# Patient Record
Sex: Female | Born: 1970
Health system: Southern US, Community
[De-identification: ages and names within clinical notes are randomized; demographics above are authoritative.]

## PROBLEM LIST (undated history)

## (undated) DIAGNOSIS — I10 Essential (primary) hypertension: Secondary | ICD-10-CM

---

## 2013-07-30 ENCOUNTER — Other Ambulatory Visit (HOSPITAL_COMMUNITY): Payer: Self-pay | Admitting: *Deleted

## 2013-07-30 DIAGNOSIS — N6320 Unspecified lump in the left breast, unspecified quadrant: Secondary | ICD-10-CM

## 2013-07-30 DIAGNOSIS — L97121 Non-pressure chronic ulcer of left thigh limited to breakdown of skin: Secondary | ICD-10-CM

## 2013-07-31 ENCOUNTER — Encounter (INDEPENDENT_AMBULATORY_CARE_PROVIDER_SITE_OTHER): Payer: Self-pay

## 2013-07-31 ENCOUNTER — Encounter (HOSPITAL_COMMUNITY): Payer: Self-pay

## 2013-07-31 ENCOUNTER — Ambulatory Visit (HOSPITAL_COMMUNITY)
Admission: RE | Admit: 2013-07-31 | Discharge: 2013-07-31 | Disposition: A | Discharge status: Home / Self Care | Payer: Self-pay | Source: Ambulatory Visit | Attending: Obstetrics and Gynecology | Admitting: Obstetrics and Gynecology

## 2013-07-31 VITALS — BP 128/82 | Temp 99.3°F | Ht 64.0 in | Wt 241.0 lb

## 2013-07-31 DIAGNOSIS — N6320 Unspecified lump in the left breast, unspecified quadrant: Secondary | ICD-10-CM

## 2013-07-31 DIAGNOSIS — N6325 Unspecified lump in the left breast, overlapping quadrants: Secondary | ICD-10-CM

## 2013-07-31 DIAGNOSIS — Z1239 Encounter for other screening for malignant neoplasm of breast: Secondary | ICD-10-CM

## 2013-07-31 NOTE — Patient Instructions (Signed)
Taught [name] how to perform BSE and gave educational materials to take home. Patient is on menstrual cycle rescheduled her Pap smear for Tuesday, September 11, 2013 at 1100. Let her know [facility] will cover Pap smears every 3 years unless has a history of abnormal Pap smears. Referred patient to the [facility] for diagnostic mammogram and possible left breast ultrasound. Appointment scheduled for Wednesday, August 01, 2013 at 1100. Patient aware of appointments and will be there. Debroah Derasmo verbalized understanding.  Dajon Lazar, Kathaleen Maser, RN 11:41 AM

## 2013-07-31 NOTE — Progress Notes (Signed)
Complaints of a left breast lump x 2 months that comes and goes per patient.  Pap Smear:  Pap smear not completed today. Last Pap smear was 2011 and normal per patient. Per patient has no history of an abnormal Pap smear. Patient is on her menstrual period therefore have rescheduled patient's Pap smear for Tuesday, September 11, 2013 at [facility]. No Pap smear results in EPIC.  Physical exam: Breasts Breasts symmetrical. No skin abnormalities bilateral breasts. No nipple retraction bilateral breasts. No nipple discharge bilateral breasts. No lymphadenopathy. No lumps palpated right breast. Palpated a lump within the left breast at 6 o'clock 6 cm from the nipple. No complaints of pain or tenderness on exam. Referred patient to the [facility] for diagnostic mammogram and possible left breast ultrasound. Appointment scheduled for Wednesday, August 01, 2013 at 1100.    Pelvic/Bimanual No Pap smear completed today since patient is on her menstrual period therefore have rescheduled patient's Pap smear for Tuesday, September 11, 2013 at [facility].

## 2013-08-01 ENCOUNTER — Ambulatory Visit
Admission: RE | Admit: 2013-08-01 | Discharge: 2013-08-01 | Disposition: A | Discharge status: Home / Self Care | Payer: No Typology Code available for payment source | Source: Ambulatory Visit | Attending: Obstetrics and Gynecology | Admitting: Obstetrics and Gynecology

## 2013-08-01 DIAGNOSIS — N6320 Unspecified lump in the left breast, unspecified quadrant: Secondary | ICD-10-CM

## 2013-08-17 ENCOUNTER — Other Ambulatory Visit: Payer: Self-pay

## 2013-08-17 ENCOUNTER — Ambulatory Visit: Payer: Self-pay

## 2013-09-10 ENCOUNTER — Telehealth (HOSPITAL_COMMUNITY): Payer: Self-pay | Admitting: *Deleted

## 2013-09-10 NOTE — Telephone Encounter (Signed)
Telephoned patient at home # and left message to return call to [facility] 

## 2013-09-11 ENCOUNTER — Ambulatory Visit (HOSPITAL_COMMUNITY): Payer: Self-pay

## 2013-09-14 ENCOUNTER — Ambulatory Visit: Payer: Self-pay

## 2013-09-28 ENCOUNTER — Ambulatory Visit: Payer: Self-pay

## 2013-09-28 ENCOUNTER — Other Ambulatory Visit: Payer: Self-pay

## 2013-11-02 ENCOUNTER — Ambulatory Visit: Payer: Self-pay

## 2013-11-02 ENCOUNTER — Ambulatory Visit (HOSPITAL_BASED_OUTPATIENT_CLINIC_OR_DEPARTMENT_OTHER): Payer: Self-pay

## 2013-11-02 VITALS — BP 146/102 | HR 92 | Temp 98.3°F | Resp 12 | Ht 66.0 in | Wt 241.3 lb

## 2013-11-02 DIAGNOSIS — Z0000 Encounter for general adult medical examination without abnormal findings: Secondary | ICD-10-CM

## 2013-11-02 LAB — GLUCOSE (CC13): Glucose: 94 mg/dl (ref 70–140)

## 2013-11-02 LAB — HEMOGLOBIN A1C
HEMOGLOBIN A1C: 6 % — AB (ref <5.7)
Mean Plasma Glucose: 126 mg/dL — ABNORMAL HIGH (ref <117)

## 2013-11-02 LAB — LIPID PANEL
Cholesterol: 129 mg/dL (ref 0–200)
HDL: 41 mg/dL (ref >39)
LDL Cholesterol: 78 mg/dL (ref 0–99)
Total CHOL/HDL Ratio: 3.1 Ratio
Triglycerides: 48 mg/dL (ref <150)
VLDL: 10 mg/dL (ref 0–40)

## 2013-11-02 NOTE — Progress Notes (Signed)
[facility] and is currently a [facility] patient effective 07/31/2013.   Clinical Measurements: Patient is 5 ft. 5.5 inches, weight 241.3 lbs, waist circumference 41inches, and hip circumference 54inches.   Medical History: Patient has no history of high cholesterol. Patient does not have a history of hypertension or diabetes. Per patient no diagnosed history of coronary heart disease, heart attack, heart failure, stroke/TIA, vascular disease or congenital heart defects.   Blood Pressure, Self-measurement: Patient states has no reason to check Blood pressure.  Nutrition Assessment: Patient stated that eats 1 fruit every day. Patient states she eats 3 servings of vegetables a day.. She  eat 3 or more ounces of whole grains daily. Patient doesn't eat two or more servings of fish weekly. Patient states she does drink more than 36 ounces or 450 calories of beverages with added sugars weekly. Patient states she drinks a lot of water. Patient stated she does  watch her salt intake.  Physical Activity Assessment: Patient stated she does  440 minutes moderate exercise a week and180 minute of vigorous exercise a week playing basketball at gym..  Smoking Status: Patient has never smoked and is not around smokers.  Quality of Life Assessment: In assessing patient's quality of life she stated that out of the past 30 days that she has felt her health is good all of them. Patient also stated that in the past 30 days that her mental health is good including stress, depression and problems with emotions. Patient did state that out of the past 30 days she felt her physical or mental health had not kept her from doing her usual activities including self-care, work or recreation.   Plan: Lab work will be done today including a lipid panel, blood glucose, and Hgb A1C. Will call lab results when they are finished. Will work on behavior modifications that we discussed to lower BP.Will  schedule Health Coaching and/or clinic visit as needed. Patient verbalized understanding.

## 2013-11-02 NOTE — Patient Instructions (Signed)
Discussed health assessment with patient. Informed patient that she would need to be followed up for nutrition and blood pressure. She will be called with results of lab work and we will then discussed any further follow up the patient needs. Patient will implement behavior modifications to help lower BP. Patient verbalized understanding. 

## 2013-11-14 ENCOUNTER — Telehealth (HOSPITAL_COMMUNITY): Payer: Self-pay | Admitting: *Deleted

## 2013-11-14 NOTE — Telephone Encounter (Signed)
Telephoned patient at home # and left message to return call to [facility] 

## 2013-11-15 ENCOUNTER — Ambulatory Visit (INDEPENDENT_AMBULATORY_CARE_PROVIDER_SITE_OTHER): Payer: Self-pay | Admitting: *Deleted

## 2013-11-15 DIAGNOSIS — Z789 Other specified health status: Secondary | ICD-10-CM

## 2013-11-15 NOTE — Progress Notes (Signed)
Patient returns today for Health Coaching regarding Hypertension, Activity, and Nutrition for her borderline AIC.  HYPERTENSION:  Discussed hypertension: cause, effects, treatment, healthy weight, eating right and medication. Went over and gave handout concerning ten ways to decrease salt in your life.  NUTRITION/CHOLESTEROL: Discussed Counting Carbohydrates.  Patient received handouts on: What's on your Plate, Choose My Plate, and Counting Carbohydrates.  ACTIVITY: Discussed that could loose weight and decrease AIC by increasing physical activity.  PLAN: Review handouts and follow information at home. Will call next couple of weeks to follow up on how patient is doing.Will do further health coaching as needed and follow glucose.

## 2013-11-15 NOTE — Patient Instructions (Signed)
Discussed health assessment with patient. Discussed carbohydrate counting, reading labels, decreasing salt and portion control. Walking more and Patient verbalized understanding.

## 2013-12-11 ENCOUNTER — Ambulatory Visit (HOSPITAL_COMMUNITY): Payer: Self-pay

## 2014-01-01 ENCOUNTER — Ambulatory Visit: Payer: Self-pay | Attending: Internal Medicine | Admitting: Internal Medicine

## 2014-01-01 ENCOUNTER — Encounter: Payer: Self-pay | Admitting: Internal Medicine

## 2014-01-01 VITALS — BP 150/80 | HR 89 | Temp 98.3°F | Resp 16 | Wt 238.0 lb

## 2014-01-01 DIAGNOSIS — Z006 Encounter for examination for normal comparison and control in clinical research program: Secondary | ICD-10-CM | POA: Insufficient documentation

## 2014-01-01 DIAGNOSIS — R7303 Prediabetes: Secondary | ICD-10-CM | POA: Insufficient documentation

## 2014-01-01 DIAGNOSIS — Z139 Encounter for screening, unspecified: Secondary | ICD-10-CM

## 2014-01-01 DIAGNOSIS — R7309 Other abnormal glucose: Secondary | ICD-10-CM | POA: Insufficient documentation

## 2014-01-01 DIAGNOSIS — I10 Essential (primary) hypertension: Secondary | ICD-10-CM | POA: Insufficient documentation

## 2014-01-01 DIAGNOSIS — N920 Excessive and frequent menstruation with regular cycle: Secondary | ICD-10-CM | POA: Insufficient documentation

## 2014-01-01 DIAGNOSIS — E669 Obesity, unspecified: Secondary | ICD-10-CM | POA: Insufficient documentation

## 2014-01-01 LAB — CBC WITH DIFFERENTIAL/PLATELET
Basophils Absolute: 0.1 10*3/uL (ref 0.0–0.1)
Basophils Relative: 1 % (ref 0–1)
Eosinophils Absolute: 0.3 10*3/uL (ref 0.0–0.7)
Eosinophils Relative: 3 % (ref 0–5)
HEMATOCRIT: 25.7 % — AB (ref 36.0–46.0)
HEMOGLOBIN: 7.5 g/dL — AB (ref 12.0–15.0)
LYMPHS ABS: 2.4 10*3/uL (ref 0.7–4.0)
Lymphocytes Relative: 29 % (ref 12–46)
MCH: 17 pg — ABNORMAL LOW (ref 26.0–34.0)
MCHC: 29.2 g/dL — ABNORMAL LOW (ref 30.0–36.0)
MCV: 58.1 fL — ABNORMAL LOW (ref 78.0–100.0)
MONO ABS: 0.7 10*3/uL (ref 0.1–1.0)
MONOS PCT: 8 % (ref 3–12)
NEUTROS ABS: 5 10*3/uL (ref 1.7–7.7)
Neutrophils Relative %: 59 % (ref 43–77)
Platelets: 379 10*3/uL (ref 150–400)
RBC: 4.42 MIL/uL (ref 3.87–5.11)
RDW: 21.4 % — ABNORMAL HIGH (ref 11.5–15.5)
WBC: 8.4 10*3/uL (ref 4.0–10.5)

## 2014-01-01 LAB — COMPLETE METABOLIC PANEL WITH GFR
ALK PHOS: 83 U/L (ref 39–117)
ALT: 11 U/L (ref 0–35)
AST: 12 U/L (ref 0–37)
Albumin: 3.9 g/dL (ref 3.5–5.2)
BUN: 10 mg/dL (ref 6–23)
CALCIUM: 9.1 mg/dL (ref 8.4–10.5)
CHLORIDE: 105 meq/L (ref 96–112)
CO2: 27 mEq/L (ref 19–32)
CREATININE: 0.76 mg/dL (ref 0.50–1.10)
GFR, Est African American: >89 mL/min
GFR, Est Non African American: >89 mL/min
Glucose, Bld: 102 mg/dL — ABNORMAL HIGH (ref 70–99)
Potassium: 4.1 mEq/L (ref 3.5–5.3)
Sodium: 138 mEq/L (ref 135–145)
Total Bilirubin: 0.4 mg/dL (ref 0.2–1.2)
Total Protein: 7.1 g/dL (ref 6.0–8.3)

## 2014-01-01 MED ORDER — AMLODIPINE BESYLATE 2.5 MG PO TABS: 2.5000 mg | ORAL_TABLET | Freq: Every day | ORAL | Status: DC

## 2014-01-01 NOTE — Progress Notes (Signed)
Patient here to establish care  

## 2014-01-01 NOTE — Patient Instructions (Signed)
Diabetes Meal Planning Guide The diabetes meal planning guide is a tool to help you plan your meals and snacks. It is important for people with diabetes to manage their blood glucose (sugar) levels. Choosing the right foods and the right amounts throughout your day will help control your blood glucose. Eating right can even help you improve your blood pressure and reach or maintain a healthy weight. CARBOHYDRATE COUNTING MADE EASY When you eat carbohydrates, they turn to sugar. This raises your blood glucose level. Counting carbohydrates can help you control this level so you feel better. When you plan your meals by counting carbohydrates, you can have more flexibility in what you eat and balance your medicine with your food intake. Carbohydrate counting simply means adding up the total amount of carbohydrate grams in your meals and snacks. Try to eat about the same amount at each meal. Foods with carbohydrates are listed below. Each portion below is 1 carbohydrate serving or 15 grams of carbohydrates. Ask your dietician how many grams of carbohydrates you should eat at each meal or snack. Grains and Starches  1 slice bread.   English muffin or hotdog/hamburger bun.   cup cold cereal (unsweetened).   cup cooked pasta or rice.   cup starchy vegetables (corn, potatoes, peas, beans, winter squash).  1 tortilla (6 inches).   bagel.  1 waffle or pancake (size of a CD).   cup cooked cereal.  4 to 6 small crackers. *Whole grain is recommended. Fruit  1 cup fresh unsweetened berries, melon, papaya, pineapple.  1 small fresh fruit.   banana or mango.   cup fruit juice (4 oz unsweetened).   cup canned fruit in natural juice or water.  2 tbs dried fruit.  12 to 15 grapes or cherries. Milk and Yogurt  1 cup fat-free or 1% milk.  1 cup soy milk.  6 oz light yogurt with sugar-free sweetener.  6 oz low-fat soy yogurt.  6 oz plain yogurt. Vegetables  1 cup raw or  cup  cooked is counted as 0 carbohydrates or a "free" food.  If you eat 3 or more servings at 1 meal, count them as 1 carbohydrate serving. Other Carbohydrates   oz chips or pretzels.   cup ice cream or frozen yogurt.   cup sherbet or sorbet.  2 inch square cake, no frosting.  1 tbs honey, sugar, jam, jelly, or syrup.  2 small cookies.  3 squares of graham crackers.  3 cups popcorn.  6 crackers.  1 cup broth-based soup.  Count 1 cup casserole or other mixed foods as 2 carbohydrate servings.  Foods with less than 20 calories in a serving may be counted as 0 carbohydrates or a "free" food. You may want to purchase a book or computer software that lists the carbohydrate gram counts of different foods. In addition, the nutrition facts panel on the labels of the foods you eat are a good source of this information. The label will tell you how big the serving size is and the total number of carbohydrate grams you will be eating per serving. Divide this number by 15 to obtain the number of carbohydrate servings in a portion. Remember, 1 carbohydrate serving equals 15 grams of carbohydrate. SERVING SIZES Measuring foods and serving sizes helps you make sure you are getting the right amount of food. The list below tells how big or small some common serving sizes are.  1 oz.........4 stacked dice.  3 oz.........Deck of cards.  1 tsp........Tip   of little finger.  1 tbs........Thumb.  2 tbs........Golf ball.   cup.......Half of a fist.  1 cup........A fist. SAMPLE DIABETES MEAL PLAN Below is a sample meal plan that includes foods from the grain and starches, dairy, vegetable, fruit, and meat groups. A dietician can individualize a meal plan to fit your calorie needs and tell you the number of servings needed from each food group. However, controlling the total amount of carbohydrates in your meal or snack is more important than making sure you include all of the food groups at every  meal. You may interchange carbohydrate containing foods (dairy, starches, and fruits). The meal plan below is an example of a 2000 calorie diet using carbohydrate counting. This meal plan has 17 carbohydrate servings. Breakfast  1 cup oatmeal (2 carb servings).   cup light yogurt (1 carb serving).  1 cup blueberries (1 carb serving).   cup almonds. Snack  1 large apple (2 carb servings).  1 low-fat string cheese stick. Lunch  Chicken breast salad.  1 cup spinach.   cup chopped tomatoes.  2 oz chicken breast, sliced.  2 tbs low-fat Italian dressing.  12 whole-wheat crackers (2 carb servings).  12 to 15 grapes (1 carb serving).  1 cup low-fat milk (1 carb serving). Snack  1 cup carrots.   cup hummus (1 carb serving). Dinner  3 oz broiled salmon.  1 cup brown rice (3 carb servings). Snack  1  cups steamed broccoli (1 carb serving) drizzled with 1 tsp olive oil and lemon juice.  1 cup light pudding (2 carb servings). DIABETES MEAL PLANNING WORKSHEET Your dietician can use this worksheet to help you decide how many servings of foods and what types of foods are right for you.  BREAKFAST Food Group and Servings / Carb Servings Grain/Starches __________________________________ Dairy __________________________________________ Vegetable ______________________________________ Fruit ___________________________________________ Meat __________________________________________ Fat ____________________________________________ LUNCH Food Group and Servings / Carb Servings Grain/Starches ___________________________________ Dairy ___________________________________________ Fruit ____________________________________________ Meat ___________________________________________ Fat _____________________________________________ DINNER Food Group and Servings / Carb Servings Grain/Starches ___________________________________ Dairy  ___________________________________________ Fruit ____________________________________________ Meat ___________________________________________ Fat _____________________________________________ SNACKS Food Group and Servings / Carb Servings Grain/Starches ___________________________________ Dairy ___________________________________________ Vegetable _______________________________________ Fruit ____________________________________________ Meat ___________________________________________ Fat _____________________________________________ DAILY TOTALS Starches _________________________ Vegetable ________________________ Fruit ____________________________ Dairy ____________________________ Meat ____________________________ Fat ______________________________ Document Released: 04/08/2005 Document Revised: 10/04/2011 Document Reviewed: 02/17/2009 Exit[employer] Patient Information 2014 [employer]. DASH Diet The DASH diet stands for "Dietary Approaches to Stop Hypertension." It is a healthy eating plan that has been shown to reduce high blood pressure (hypertension) in as little as 14 days, while also possibly providing other significant health benefits. These other health benefits include reducing the risk of breast cancer after menopause and reducing the risk of type 2 diabetes, heart disease, colon cancer, and stroke. Health benefits also include weight loss and slowing kidney failure in patients with chronic kidney disease.  DIET GUIDELINES  Limit salt (sodium). Your diet should contain less than 1500 mg of sodium daily.  Limit refined or processed carbohydrates. Your diet should include mostly whole grains. Desserts and added sugars should be used sparingly.  Include small amounts of heart-healthy fats. These types of fats include nuts, oils, and tub margarine. Limit saturated and trans fats. These fats have been shown to be harmful in the body. CHOOSING FOODS  The following food groups  are based on a 2000 calorie diet. See your Registered Dietitian for individual calorie needs. Grains and Grain Products (6 to 8 servings daily)  Eat More Often:   Whole-wheat bread, brown rice, whole-grain or wheat pasta, quinoa, popcorn without added fat or salt (air popped).  Eat Less Often: White bread, white pasta, white rice, cornbread. Vegetables (4 to 5 servings daily)  Eat More Often: Fresh, frozen, and canned vegetables. Vegetables may be raw, steamed, roasted, or grilled with a minimal amount of fat.  Eat Less Often/Avoid: Creamed or fried vegetables. Vegetables in a cheese sauce. Fruit (4 to 5 servings daily)  Eat More Often: All fresh, canned (in natural juice), or frozen fruits. Dried fruits without added sugar. One hundred percent fruit juice ( cup [237 mL] daily).  Eat Less Often: Dried fruits with added sugar. Canned fruit in light or heavy syrup. Lean Meats, Fish, and Poultry (2 servings or less daily. One serving is 3 to 4 oz [85-114 g]).  Eat More Often: Ninety percent or leaner ground beef, tenderloin, sirloin. Round cuts of beef, chicken breast, turkey breast. All fish. Grill, bake, or broil your meat. Nothing should be fried.  Eat Less Often/Avoid: Fatty cuts of meat, turkey, or chicken leg, thigh, or wing. Fried cuts of meat or fish. Dairy (2 to 3 servings)  Eat More Often: Low-fat or fat-free milk, low-fat plain or light yogurt, reduced-fat or part-skim cheese.  Eat Less Often/Avoid: Milk (whole, 2%).Whole milk yogurt. Full-fat cheeses. Nuts, Seeds, and Legumes (4 to 5 servings per week)  Eat More Often: All without added salt.  Eat Less Often/Avoid: Salted nuts and seeds, canned beans with added salt. Fats and Sweets (limited)  Eat More Often: Vegetable oils, tub margarines without trans fats, sugar-free gelatin. Mayonnaise and salad dressings.  Eat Less Often/Avoid: Coconut oils, palm oils, butter, stick margarine, cream, half and half, cookies, candy,  pie. FOR MORE INFORMATION The Dash Diet Eating Plan: www.dashdiet.org Document Released: 07/01/2011 Document Revised: 10/04/2011 Document Reviewed: 07/01/2011 ExitCare Patient Information 2014 [employer].  

## 2014-01-01 NOTE — Progress Notes (Signed)
Patient Demographics  Kathryn Footmanonya Defranco, is a 43 y.o. female  [id number]  [id number]  DOB - 06/09/1971  CC:  Chief Complaint  Patient presents with  . Establish Care       HPI: Kathryn Barrett is a 43 y.o. female here today to establish medical care. Patient has history of prediabetes, as well as her blood pressure has been running high, her previous blood work noticed A1c of 6.0%, she reported to have family history of diabetes, she denies any acute symptoms denies any headache dizziness chest and shortness of breath, she reported to have menorrhagia and following up with her gynecologist. Patient has No headache, No chest pain, No abdominal pain - No Nausea, No new weakness tingling or numbness, No Cough - SOB.  No Known Allergies History reviewed. No pertinent past medical history. No current outpatient prescriptions on file prior to visit.   No current facility-administered medications on file prior to visit.   Family History  Problem Relation Age of Onset  . Breast cancer Mother   . Diabetes Maternal Grandmother   . Diabetes Maternal Grandfather   . Diabetes Paternal Grandmother   . Diabetes Paternal Grandfather    History   Social History  . Marital Status: Single    Spouse Name: N/A    Number of Children: N/A  . Years of Education: N/A   Occupational History  . Not on file.   Social History Main Topics  . Smoking status: Never Smoker   . Smokeless tobacco: Never Used  . Alcohol Use: No  . Drug Use: No  . Sexual Activity: Yes   Other Topics Concern  . Not on file   Social History Narrative  . No narrative on file    Review of Systems: Constitutional: Negative for fever, chills, diaphoresis, activity change, appetite change and fatigue. HENT: Negative for ear pain, nosebleeds, congestion, facial swelling, rhinorrhea, neck pain, neck stiffness and ear discharge.  Eyes: Negative for pain, discharge, redness, itching and visual  disturbance. Respiratory: Negative for cough, choking, chest tightness, shortness of breath, wheezing and stridor.  Cardiovascular: Negative for chest pain, palpitations and leg swelling. Gastrointestinal: Negative for abdominal distention. Genitourinary: Negative for dysuria, urgency, frequency, hematuria, flank pain, decreased urine volume, difficulty urinating and dyspareunia.  Musculoskeletal: Negative for back pain, joint swelling, arthralgia and gait problem. Neurological: Negative for dizziness, tremors, seizures, syncope, facial asymmetry, speech difficulty, weakness, light-headedness, numbness and headaches.  Hematological: Negative for adenopathy. Does not bruise/bleed easily. Psychiatric/Behavioral: Negative for hallucinations, behavioral problems, confusion, dysphoric mood, decreased concentration and agitation.    Objective:   Filed Vitals:   01/01/14 1154  BP: 150/80  Pulse:   Temp:   Resp:     Physical Exam: Constitutional: Obese female sitting comfortably not in acute distress HENT: Normocephalic, atraumatic, External right and left ear normal. Oropharynx is clear and moist.  Eyes: Conjunctivae and EOM are normal. PERRLA, no scleral icterus. Neck: Normal ROM. Neck supple. No JVD. No tracheal deviation. No thyromegaly. CVS: RRR, S1/S2 +, no murmurs, no gallops, no carotid bruit.  Pulmonary: Effort and breath sounds normal, no stridor, rhonchi, wheezes, rales.  Abdominal: Soft. BS +, no distension, tenderness, rebound or guarding.  Musculoskeletal: Normal range of motion. No edema and no tenderness.  Neuro: Alert. Normal reflexes, muscle tone coordination. No cranial nerve deficit. Skin: Skin is warm and dry. No rash noted. Not diaphoretic. No erythema. No pallor. Psychiatric: Normal mood and affect. Behavior, judgment, thought content normal.  No results  found for this basename: WBC, HGB, HCT, MCV, PLT   No results found for this basename: CREATININE, BUN, NA, K,  CL, CO2    Lab Results  Component Value Date   HGBA1C 6.0* 11/02/2013   Lipid Panel     Component Value Date/Time   CHOL 129 11/02/2013 1026   TRIG 48 11/02/2013 1026   HDL 41 11/02/2013 1026   CHOLHDL 3.1 11/02/2013 1026   VLDL 10 11/02/2013 1026   LDLCALC 78 11/02/2013 1026       Assessment and plan:   1. Prediabetes I have advised patient for low carbohydrate diet , given her handout  2. Screening Baseline blood work  - COMPLETE METABOLIC PANEL WITH GFR - TSH - Vit D  25 hydroxy (rtn osteoporosis monitoring) - CBC with Differential  3. Essential hypertension, benign Advised patient for DASH diet, I have started her on low-dose Norvasc.  - amLODipine (NORVASC) 2.5 MG tablet; Take 1 tablet (2.5 mg total) by mouth daily.  Dispense: 90 tablet; Refill: 3  4. Menorrhagia Will check CBC also patient is following up with her gynecologist.     Health Maintenance  -Mammogram: uptodate    Return in about 3 months (around 04/03/2014) for hypertension, IFG.    Doris Cheadle, MD

## 2014-01-02 ENCOUNTER — Telehealth: Payer: Self-pay

## 2014-01-02 LAB — VITAMIN D 25 HYDROXY (VIT D DEFICIENCY, FRACTURES): Vit D, 25-Hydroxy: 20 ng/mL — ABNORMAL LOW (ref 30–89)

## 2014-01-02 LAB — TSH: TSH: 1.718 u[IU]/mL (ref 0.350–4.500)

## 2014-01-02 MED ORDER — VITAMIN D (ERGOCALCIFEROL) 1.25 MG (50000 UNIT) PO CAPS: 50000.0000 [IU] | ORAL_CAPSULE | ORAL | Status: DC

## 2014-01-02 NOTE — Telephone Encounter (Signed)
Patient is aware of her lab results Prescription for Vitamin D sent to [facility] Patient aware to start OTC iron supplements

## 2014-01-02 NOTE — Telephone Encounter (Signed)
Message copied by Lestine Mount on Wed Jan 02, 2014  9:58 AM ------      Message from: Doris Cheadle      Created: Wed Jan 02, 2014  9:27 AM       Blood work reviewed, noticed low vitamin D, call patient advise to start ergocalciferol 50,000 units once a week for the duration of  12 weeks.      Also she  Likely has iron deficiency anemia  secondary to menorrhagia, advise patient to  take over-the-counter iron supplement 3 times a day and also follow up with her gynecologist. ------

## 2014-01-08 ENCOUNTER — Telehealth: Payer: Self-pay

## 2014-01-08 NOTE — Telephone Encounter (Signed)
Called to follow up since her doctor's visit. Asked patient to return call.

## 2014-07-13 ENCOUNTER — Emergency Department (HOSPITAL_BASED_OUTPATIENT_CLINIC_OR_DEPARTMENT_OTHER)
Admission: EM | Admit: 2014-07-13 | Discharge: 2014-07-13 | Disposition: A | Discharge status: Home / Self Care | Payer: Self-pay | Attending: Emergency Medicine | Admitting: Emergency Medicine

## 2014-07-13 ENCOUNTER — Encounter (HOSPITAL_BASED_OUTPATIENT_CLINIC_OR_DEPARTMENT_OTHER): Payer: Self-pay

## 2014-07-13 DIAGNOSIS — I10 Essential (primary) hypertension: Secondary | ICD-10-CM | POA: Insufficient documentation

## 2014-07-13 DIAGNOSIS — E669 Obesity, unspecified: Secondary | ICD-10-CM | POA: Insufficient documentation

## 2014-07-13 DIAGNOSIS — W57XXXA Bitten or stung by nonvenomous insect and other nonvenomous arthropods, initial encounter: Secondary | ICD-10-CM | POA: Insufficient documentation

## 2014-07-13 DIAGNOSIS — Z79899 Other long term (current) drug therapy: Secondary | ICD-10-CM | POA: Insufficient documentation

## 2014-07-13 DIAGNOSIS — S40862A Insect bite (nonvenomous) of left upper arm, initial encounter: Secondary | ICD-10-CM | POA: Insufficient documentation

## 2014-07-13 DIAGNOSIS — Y998 Other external cause status: Secondary | ICD-10-CM | POA: Insufficient documentation

## 2014-07-13 DIAGNOSIS — Y9389 Activity, other specified: Secondary | ICD-10-CM | POA: Insufficient documentation

## 2014-07-13 DIAGNOSIS — Y9289 Other specified places as the place of occurrence of the external cause: Secondary | ICD-10-CM | POA: Insufficient documentation

## 2014-07-13 HISTORY — DX: Essential (primary) hypertension: I10

## 2014-07-13 MED ORDER — CEPHALEXIN 500 MG PO CAPS: ORAL_CAPSULE | ORAL | Status: DC

## 2014-07-13 MED ORDER — CEPHALEXIN 250 MG/5ML PO SUSR: 1000.0000 mg | Freq: Two times a day (BID) | ORAL | Status: AC

## 2014-07-13 NOTE — Discharge Instructions (Signed)
Please apply neosporin daily to affected area to decrease risk of infection.  If you notice signs of infection such as increase redness, tenderness, red streak extending away from site, or pus drainage then take antibiotic prescribed for the full duration.  Insect Bite Mosquitoes, flies, fleas, bedbugs, and many other insects can bite. Insect bites are different from insect stings. A sting is when venom is injected into the skin. Some insect bites can transmit infectious diseases. SYMPTOMS  Insect bites usually turn red, swell, and itch for 2 to 4 days. They often go away on their own. TREATMENT  Your caregiver may prescribe antibiotic medicines if a bacterial infection develops in the bite. HOME CARE INSTRUCTIONS  Do not scratch the bite area.  Keep the bite area clean and dry. Wash the bite area thoroughly with soap and water.  Put ice or cool compresses on the bite area.  Put ice in a plastic bag.  Place a towel between your skin and the bag.  Leave the ice on for 20 minutes, 4 times a day for the first 2 to 3 days, or as directed.  You may apply a baking soda paste, cortisone cream, or calamine lotion to the bite area as directed by your caregiver. This can help reduce itching and swelling.  Only take over-the-counter or prescription medicines as directed by your caregiver.  If you are given antibiotics, take them as directed. Finish them even if you start to feel better. You may need a tetanus shot if:  You cannot remember when you had your last tetanus shot.  You have never had a tetanus shot.  The injury broke your skin. If you get a tetanus shot, your arm may swell, get red, and feel warm to the touch. This is common and not a problem. If you need a tetanus shot and you choose not to have one, there is a rare chance of getting tetanus. Sickness from tetanus can be serious. SEEK IMMEDIATE MEDICAL CARE IF:   You have increased pain, redness, or swelling in the bite  area.  You see a red line on the skin coming from the bite.  You have a fever.  You have joint pain.  You have a headache or neck pain.  You have unusual weakness.  You have a rash.  You have chest pain or shortness of breath.  You have abdominal pain, nausea, or vomiting.  You feel unusually tired or sleepy. MAKE SURE YOU:   Understand these instructions.  Will watch your condition.  Will get help right away if you are not doing well or get worse. Document Released: 08/19/2004 Document Revised: 10/04/2011 Document Reviewed: 02/10/2011 [facility] Patient Information 2015 [place], [place]. This information is not intended to replace advice given to you by your health care provider. Make sure you discuss any questions you have with your health care provider.

## 2014-07-13 NOTE — ED Provider Notes (Signed)
CSN: [id number]     Arrival date & time 07/13/14  1027 History   First MD Initiated Contact with Patient 07/13/14 1214     Chief Complaint  Patient presents with  . Insect Bite     (Consider location/radiation/quality/duration/timing/severity/associated sxs/prior Treatment) HPI   43 year old female presents for evaluation of skin irritation. Patient works at a group home, she has noticed several skin the lesion on her left arm for the past several weeks. She noticed to skin lesion to the upper arm 2 weeks ago and now another one to her forearm for the past 3-4 days. She described itching and burning sensation to the affected region. She denies any fever or chills numbness or severe rash. Patient believes it may be related to insect bites of bedbugs however she has not seen any specific. She has no other complaints. She has been using some over-the-counter itching cream with some relief. No complaints of fever or headache.  Past Medical History  Diagnosis Date  . Hypertension    History reviewed. No pertinent past surgical history. Family History  Problem Relation Age of Onset  . Breast cancer Mother   . Diabetes Maternal Grandmother   . Diabetes Maternal Grandfather   . Diabetes Paternal Grandmother   . Diabetes Paternal Grandfather    History  Substance Use Topics  . Smoking status: Never Smoker   . Smokeless tobacco: Never Used  . Alcohol Use: No   OB History    Gravida Para Term Preterm AB TAB SAB Ectopic Multiple Living   0              Review of Systems  Constitutional: Negative for fever.  Skin:       Localized skin reaction  Neurological: Negative for numbness.      Allergies  Review of patient's allergies indicates no known allergies.  Home Medications   Prior to Admission medications   Medication Sig Start Date End Date Taking? Authorizing Provider  amLODipine (NORVASC) 2.5 MG tablet Take 1 tablet (2.5 mg total) by mouth daily. 01/01/14  Yes Deepak  Advani, MD   BP 137/96 mmHg  Pulse 102  Temp(Src) 98.3 F (36.8 C) (Oral)  Resp 20  Ht 5\' 4"  (1.626 m)  Wt 238 lb (107.956 kg)  BMI 40.83 kg/m2  SpO2 100% Physical Exam  Constitutional: She appears well-developed and well-nourished. No distress.  Obese African-American female appears to be in no acute distress, nontoxic in appearance.  HENT:  Head: Atraumatic.  Eyes: Conjunctivae are normal.  Neck: Neck supple.  Neurological: She is alert.  Skin: No rash (Patient has 3 distinct localized skin irritation noted to her left arm without any obvious signs of cellulitis or abscess. Minimal tenderness to palpation. No lymphangitis. No vesicle, pustular, or petechial rash.) noted.  Psychiatric: She has a normal mood and affect.  Nursing note and vitals reviewed.   ED Course  Procedures (including critical care time)  12:37 PM Patient with localized skin irritation likely from insect bite such as bedbugs. He does not appears to be infected however patient is concerned. Therefore I will provide Keflex to use only if she notice worsening symptoms which may suggest cellulitis. Otherwise recommend using over-the-counter Neosporin and close monitoring. Patient voiced understanding and agrees with plan.  Labs Review Labs Reviewed - No data to display  Imaging Review No results found.   EKG Interpretation None      MDM   Final diagnoses:  Insect bite of left upper arm with  local reaction, initial encounter    BP 137/96 mmHg  Pulse 102  Temp(Src) 98.3 F (36.8 C) (Oral)  Resp 20  Ht 5\' 4"  (1.626 m)  Wt 238 lb (107.956 kg)  BMI 40.83 kg/m2  SpO2 100%     Fayrene Helper, PA-C 07/13/14 1241  Hilario Quarry, MD 07/13/14 1536

## 2014-07-13 NOTE — ED Notes (Signed)
PA at bedside.

## 2014-07-13 NOTE — ED Notes (Signed)
Patient here with itching and burning to left arm and posterior neck x several days, patient thinks she has been bitten by something

## 2015-01-31 ENCOUNTER — Emergency Department (HOSPITAL_BASED_OUTPATIENT_CLINIC_OR_DEPARTMENT_OTHER)
Admission: EM | Admit: 2015-01-31 | Discharge: 2015-01-31 | Disposition: A | Discharge status: Home / Self Care | Payer: Self-pay | Attending: Emergency Medicine | Admitting: Emergency Medicine

## 2015-01-31 ENCOUNTER — Encounter (HOSPITAL_BASED_OUTPATIENT_CLINIC_OR_DEPARTMENT_OTHER): Payer: Self-pay

## 2015-01-31 ENCOUNTER — Other Ambulatory Visit: Payer: Self-pay

## 2015-01-31 DIAGNOSIS — D649 Anemia, unspecified: Secondary | ICD-10-CM | POA: Insufficient documentation

## 2015-01-31 DIAGNOSIS — Z79899 Other long term (current) drug therapy: Secondary | ICD-10-CM | POA: Insufficient documentation

## 2015-01-31 DIAGNOSIS — I10 Essential (primary) hypertension: Secondary | ICD-10-CM | POA: Insufficient documentation

## 2015-01-31 DIAGNOSIS — M79601 Pain in right arm: Secondary | ICD-10-CM | POA: Insufficient documentation

## 2015-01-31 DIAGNOSIS — R42 Dizziness and giddiness: Secondary | ICD-10-CM

## 2015-01-31 LAB — CBC WITH DIFFERENTIAL/PLATELET
Basophils Absolute: 0 10*3/uL (ref 0.0–0.1)
Basophils Relative: 1 % (ref 0–1)
EOS ABS: 0.2 10*3/uL (ref 0.0–0.7)
EOS PCT: 3 % (ref 0–5)
HEMATOCRIT: 27.4 % — AB (ref 36.0–46.0)
HEMOGLOBIN: 7.8 g/dL — AB (ref 12.0–15.0)
LYMPHS PCT: 32 % (ref 12–46)
Lymphs Abs: 2.3 10*3/uL (ref 0.7–4.0)
MCH: 16.4 pg — ABNORMAL LOW (ref 26.0–34.0)
MCHC: 28.5 g/dL — ABNORMAL LOW (ref 30.0–36.0)
MCV: 57.4 fL — ABNORMAL LOW (ref 78.0–100.0)
MONO ABS: 0.7 10*3/uL (ref 0.1–1.0)
MONOS PCT: 10 % (ref 3–12)
Neutro Abs: 3.9 10*3/uL (ref 1.7–7.7)
Neutrophils Relative %: 54 % (ref 43–77)
Platelets: 383 10*3/uL (ref 150–400)
RBC: 4.77 MIL/uL (ref 3.87–5.11)
RDW: 23.3 % — ABNORMAL HIGH (ref 11.5–15.5)
WBC: 7.2 10*3/uL (ref 4.0–10.5)

## 2015-01-31 LAB — BASIC METABOLIC PANEL
Anion gap: 7 (ref 5–15)
BUN: 8 mg/dL (ref 6–20)
CALCIUM: 9 mg/dL (ref 8.9–10.3)
CO2: 27 mmol/L (ref 22–32)
Chloride: 103 mmol/L (ref 101–111)
Creatinine, Ser: 0.77 mg/dL (ref 0.44–1.00)
GFR calc Af Amer: >60 mL/min (ref >60)
GLUCOSE: 112 mg/dL — AB (ref 65–99)
Potassium: 3.6 mmol/L (ref 3.5–5.1)
Sodium: 137 mmol/L (ref 135–145)

## 2015-01-31 LAB — TROPONIN I: Troponin I: <0.03 ng/mL (ref <0.031)

## 2015-01-31 MED ORDER — MECLIZINE HCL 25 MG PO TABS: 25.0000 mg | ORAL_TABLET | Freq: Three times a day (TID) | ORAL | Status: DC | PRN

## 2015-01-31 MED ORDER — MECLIZINE HCL 25 MG PO TABS
25.0000 mg | ORAL_TABLET | Freq: Once | ORAL | Status: AC
  Administered 2015-01-31: 25 mg via ORAL
  Filled 2015-01-31: qty 1

## 2015-01-31 NOTE — Discharge Instructions (Signed)
Take the prescribed medication as directed. Follow-up with your primary care physician-- of note please make sure to keep close check of your hemoglobin (red blood cell count). Return to the ED for new or worsening symptoms.

## 2015-01-31 NOTE — ED Notes (Signed)
C/o dizziness since 7/4 evening-pain only to right arm "mainly at night time"

## 2015-01-31 NOTE — ED Provider Notes (Signed)
CSN: [id number]     Arrival date & time 01/31/15  1142 History   First MD Initiated Contact with Patient 01/31/15 1158     Chief Complaint  Patient presents with  . Dizziness     (Consider location/radiation/quality/duration/timing/severity/associated sxs/prior Treatment) Patient is a 44 y.o. female presenting with dizziness. The history is provided by the patient and medical records.  Dizziness  44 year old female with past medical history significant for hypertension, presenting to the ED for dizziness that has been ongoing for the past 4 days. Patient described her dizziness as a "off balance". She does have the sensation that things are moving around her.  She denies any focal numbness, weakness, visual disturbance, confusion, changes in speech, or gait disturbance. Patient has no prior history of TIA or stroke. She is not currently on any type of anticoagulation. No headache, neck pain, fever, or chills.  Patient also notes some right arm pain which is very mild. She states this generally occurs at night, none usually during the day. She denies any known injury, trauma, falls, or heavy lifting. She denies any chest pain or shortness of breath. She has no known cardiac history. She has never been a smoker.  No intervention tried PTA.  Past Medical History  Diagnosis Date  . Hypertension    History reviewed. No pertinent past surgical history. Family History  Problem Relation Age of Onset  . Breast cancer Mother   . Diabetes Maternal Grandmother   . Diabetes Maternal Grandfather   . Diabetes Paternal Grandmother   . Diabetes Paternal Grandfather    History  Substance Use Topics  . Smoking status: Never Smoker   . Smokeless tobacco: Never Used  . Alcohol Use: No   OB History    Gravida Para Term Preterm AB TAB SAB Ectopic Multiple Living   0              Review of Systems  Neurological: Positive for dizziness.  All other systems reviewed and are negative.     Allergies   Review of patient's allergies indicates no known allergies.  Home Medications   Prior to Admission medications   Medication Sig Start Date End Date Taking? Authorizing Provider  amLODipine (NORVASC) 2.5 MG tablet Take 1 tablet (2.5 mg total) by mouth daily. 01/01/14   Doris Cheadle, MD   BP 159/91 mmHg  Pulse 94  Temp(Src) 98.3 F (36.8 C) (Oral)  Resp 18  Ht  (1.626 m)  Wt 225 lb (102.059 kg)  BMI 38.60 kg/m2  SpO2 100%  LMP 01/11/2015   Physical Exam  Constitutional: She is oriented to person, place, and time. She appears well-developed and well-nourished.  HENT:  Head: Normocephalic and atraumatic.  Right Ear: Tympanic membrane and ear canal normal.  Left Ear: Tympanic membrane and ear canal normal.  Nose: Nose normal.  Mouth/Throat: Uvula is midline, oropharynx is clear and moist and mucous membranes are normal. No oropharyngeal exudate, posterior oropharyngeal edema, posterior oropharyngeal erythema or tonsillar abscesses.  Eyes: Conjunctivae and EOM are normal. Pupils are equal, round, and reactive to light.  EOMs fully intact  Neck: Normal range of motion and full passive range of motion without pain. No rigidity.  No rigidity, no meningismus  Cardiovascular: Normal rate, regular rhythm and normal heart sounds.   Pulmonary/Chest: Effort normal and breath sounds normal. No respiratory distress. She has no wheezes.  Abdominal: Soft. Bowel sounds are normal.  Musculoskeletal: Normal range of motion.  Neurological: She is alert and oriented  to person, place, and time.  AAOx3, answering questions appropriately; equal strength UE and LE bilaterally; CN grossly intact; moves all extremities appropriately without ataxia; no focal neuro deficits or facial asymmetry appreciated  Skin: Skin is warm and dry.  Psychiatric: She has a normal mood and affect.  Nursing note and vitals reviewed.   ED Course  Procedures (including critical care time) Labs Review Labs Reviewed   CBC WITH DIFFERENTIAL/PLATELET - Abnormal; Notable for the following:    Hemoglobin 7.8 (*)    HCT 27.4 (*)    MCV 57.4 (*)    MCH 16.4 (*)    MCHC 28.5 (*)    RDW 23.3 (*)    All other components within normal limits  BASIC METABOLIC PANEL - Abnormal; Notable for the following:    Glucose, Bld 112 (*)    All other components within normal limits  TROPONIN I    Imaging Review No results found.   EKG Interpretation   Date/Time:  Friday January 31 2015 11:51:47 EDT Ventricular Rate:  93 PR Interval:  162 QRS Duration: 82 QT Interval:  368 QTC Calculation: 457 R Axis:   17 Text Interpretation:  Normal sinus rhythm Nonspecific T wave abnormality  Abnormal ECG No previous ECGs available Confirmed by ZACKOWSKI  MD, SCOTT  ([id number]) on 01/31/2015 1:46:56 PM      MDM   Final diagnoses:  Dizziness  Anemia, unspecified anemia type   44 year old female here with 4 days of dizziness. There seems to be a vertiginous component to her symptoms. Her neurologic exam is nonfocal and I have low suspicion for TIA or stroke at this time. She has some mild right arm pain without chest pain or shortness of breath.  Screening lab work pending, EKG is reassuring. Will give dose of meclizine as well.  Labwork notable for anemia which appears stable when compared with previous. Patient has long-standing history of anemia secondary to menorrhagia. This is followed by her PCP. denies recent increase in blood loss or bloody stools.  Remainder of lab work is reassuring.  Troponin is negative.  Symptoms would be very atypical for ACS, PE. After dose of meclizine, patient states significant improvement in her symptoms. She has been able to ambulate to the restroom unassisted without difficulty. Her neurologic exam remains nonfocal. Suspect peripheral vertigo. Will discharge home with meclizine as needed. Patient is to follow-up closely with her PCP.  Discussed plan with patient, he/she acknowledged understanding  and agreed with plan of care.  Return precautions given for new or worsening symptoms.  Garlon Hatchet, PA-C 01/31/15 1437  Vanetta Mulders, MD 02/01/15 [phone]

## 2015-02-04 ENCOUNTER — Other Ambulatory Visit: Payer: Self-pay

## 2015-02-04 DIAGNOSIS — Z1231 Encounter for screening mammogram for malignant neoplasm of breast: Secondary | ICD-10-CM

## 2015-02-04 DIAGNOSIS — Z803 Family history of malignant neoplasm of breast: Secondary | ICD-10-CM

## 2015-02-05 ENCOUNTER — Other Ambulatory Visit: Payer: Self-pay | Admitting: Internal Medicine

## 2015-02-25 ENCOUNTER — Ambulatory Visit: Payer: Self-pay | Attending: Internal Medicine | Admitting: Internal Medicine

## 2015-02-25 ENCOUNTER — Encounter: Payer: Self-pay | Admitting: Internal Medicine

## 2015-02-25 VITALS — BP 133/92 | HR 88 | Temp 98.0°F | Resp 16 | Wt 244.6 lb

## 2015-02-25 DIAGNOSIS — D509 Iron deficiency anemia, unspecified: Secondary | ICD-10-CM | POA: Insufficient documentation

## 2015-02-25 DIAGNOSIS — Z79899 Other long term (current) drug therapy: Secondary | ICD-10-CM | POA: Insufficient documentation

## 2015-02-25 DIAGNOSIS — I10 Essential (primary) hypertension: Secondary | ICD-10-CM | POA: Insufficient documentation

## 2015-02-25 DIAGNOSIS — R7301 Impaired fasting glucose: Secondary | ICD-10-CM | POA: Insufficient documentation

## 2015-02-25 DIAGNOSIS — N920 Excessive and frequent menstruation with regular cycle: Secondary | ICD-10-CM | POA: Insufficient documentation

## 2015-02-25 MED ORDER — AMLODIPINE BESYLATE 2.5 MG PO TABS: 2.5000 mg | ORAL_TABLET | Freq: Every day | ORAL | Status: DC

## 2015-02-25 MED ORDER — FERROUS SULFATE 325 (65 FE) MG PO TABS: 325.0000 mg | ORAL_TABLET | Freq: Three times a day (TID) | ORAL | Status: DC

## 2015-02-25 NOTE — Progress Notes (Signed)
MRN: [id number] Name: Kathryn Barrett  Sex: female Age: 44 y.o. DOB: Dec 10, 1970  Allergies: Review of patient's allergies indicates no known allergies.  Chief Complaint  Patient presents with  . Follow-up    HPI: Patient is 44 y.o. female who has to of hypertension, impaired fasting glucose, menorrhagia, deficiency anemia, patient comes today for follow up, last month she went to the emergency room with symptoms of dizziness, EMR reviewed patient was prescribed meclizine reports improvement in the symptoms, she still has anemia second to menorrhagia, patient has not been taking iron supplements, she used  to follow with her gynecologist in the past, currently denies any acute symptoms.patient also ran out of her blood pressure medication.  Past Medical History  Diagnosis Date  . Hypertension     History reviewed. No pertinent past surgical history.    Medication List       This list is accurate as of: 02/25/15  9:33 AM.  Always use your most recent med list.               amLODipine 2.5 MG tablet  Commonly known as:  NORVASC  Take 1 tablet (2.5 mg total) by mouth daily.     ferrous sulfate 325 (65 FE) MG tablet  Commonly known as:  FERROUSUL  Take 1 tablet (325 mg total) by mouth 3 (three) times daily with meals.     meclizine 25 MG tablet  Commonly known as:  ANTIVERT  Take 1 tablet (25 mg total) by mouth 3 (three) times daily as needed.        Meds ordered this encounter  Medications  . amLODipine (NORVASC) 2.5 MG tablet    Sig: Take 1 tablet (2.5 mg total) by mouth daily.    Dispense:  90 tablet    Refill:  3  . ferrous sulfate (FERROUSUL) 325 (65 FE) MG tablet    Sig: Take 1 tablet (325 mg total) by mouth 3 (three) times daily with meals.    Dispense:  90 tablet    Refill:  3     There is no immunization history on file for this patient.  Family History  Problem Relation Age of Onset  . Breast cancer Mother   . Diabetes Maternal Grandmother   .  Diabetes Maternal Grandfather   . Diabetes Paternal Grandmother   . Diabetes Paternal Grandfather     History  Substance Use Topics  . Smoking status: Never Smoker   . Smokeless tobacco: Never Used  . Alcohol Use: No    Review of Systems   As noted in HPI  Filed Vitals:   02/25/15 0907  BP: 133/92  Pulse: 88  Temp: 98 F (36.7 C)  Resp: 16    Physical Exam  Physical Exam  Constitutional: No distress.  Eyes: EOM are normal. Pupils are equal, round, and reactive to light.  Cardiovascular: Normal rate and regular rhythm.   Pulmonary/Chest: Breath sounds normal. No respiratory distress. She has no wheezes.  Musculoskeletal: She exhibits no edema.    Labs   Lab Results  Component Value Date   WBC 7.2 01/31/2015   HGB 7.8* 01/31/2015   HCT 27.4* 01/31/2015   PLT 383 01/31/2015   GLUCOSE 112* 01/31/2015   CHOL 129 11/02/2013   TRIG 48 11/02/2013   HDL 41 11/02/2013   LDLCALC 78 11/02/2013   ALT 11 01/01/2014   AST 12 01/01/2014   NA 137 01/31/2015   K 3.6 01/31/2015   CL 103  01/31/2015   CREATININE 0.77 01/31/2015   BUN 8 01/31/2015   CO2 27 01/31/2015   TSH 1.718 01/01/2014   HGBA1C 6.0* 11/02/2013    Lab Results  Component Value Date   HGBA1C 6.0* 11/02/2013     Assessment and Plan  Essential hypertension, benign - Plan: advised patient for DASH diet, resume back on amLODipine (NORVASC) 2.5 MG tablet  Anemia, iron deficiency - Plan:Started patient on iron supplement, advise patient to take 3 times a day  ferrous sulfate (FERROUSUL) 325 (65 FE) MG tablet  IFG (impaired fasting glucose) Advise patient for low carbohydrate diet, check fasting blood chemistry on the following visit  Menorrhagia with regular cycle - Plan: Ambulatory referral to Gynecology   Health Maintenance  -Pap Smear: referred to GYN   Return in about 3 months (around 05/28/2015), or if symptoms worsen or fail to improve.   This note has been created with Engineer, agricultural. Any transcriptional errors are unintentional.    Doris Cheadle, MD

## 2015-02-25 NOTE — Patient Instructions (Addendum)
DASH Eating Plan DASH stands for "Dietary Approaches to Stop Hypertension." The DASH eating plan is a healthy eating plan that has been shown to reduce high blood pressure (hypertension). Additional health benefits may include reducing the risk of type 2 diabetes mellitus, heart disease, and stroke. The DASH eating plan may also help with weight loss. WHAT DO I NEED TO KNOW ABOUT THE DASH EATING PLAN? For the DASH eating plan, you will follow these general guidelines:  Choose foods with a percent daily value for sodium of less than 5% (as listed on the food label).  Use salt-free seasonings or herbs instead of table salt or sea salt.  Check with your health care provider or pharmacist before using salt substitutes.  Eat lower-sodium products, often labeled as "lower sodium" or "no salt added."  Eat fresh foods.  Eat more vegetables, fruits, and low-fat dairy products.  Choose whole grains. Look for the word "whole" as the first word in the ingredient list.  Choose fish and skinless chicken or turkey more often than red meat. Limit fish, poultry, and meat to 6 oz (170 g) each day.  Limit sweets, desserts, sugars, and sugary drinks.  Choose heart-healthy fats.  Limit cheese to 1 oz (28 g) per day.  Eat more home-cooked food and less restaurant, buffet, and fast food.  Limit fried foods.  Cook foods using methods other than frying.  Limit canned vegetables. If you do use them, rinse them well to decrease the sodium.  When eating at a restaurant, ask that your food be prepared with less salt, or no salt if possible. WHAT FOODS CAN I EAT? Seek help from a dietitian for individual calorie needs. Grains Whole grain or whole wheat bread. Brown rice. Whole grain or whole wheat pasta. Quinoa, bulgur, and whole grain cereals. Low-sodium cereals. Corn or whole wheat flour tortillas. Whole grain cornbread. Whole grain crackers. Low-sodium crackers. Vegetables Fresh or frozen vegetables  (raw, steamed, roasted, or grilled). Low-sodium or reduced-sodium tomato and vegetable juices. Low-sodium or reduced-sodium tomato sauce and paste. Low-sodium or reduced-sodium canned vegetables.  Fruits All fresh, canned (in natural juice), or frozen fruits. Meat and Other Protein Products Ground beef (85% or leaner), grass-fed beef, or beef trimmed of fat. Skinless chicken or turkey. Ground chicken or turkey. Pork trimmed of fat. All fish and seafood. Eggs. Dried beans, peas, or lentils. Unsalted nuts and seeds. Unsalted canned beans. Dairy Low-fat dairy products, such as skim or 1% milk, 2% or reduced-fat cheeses, low-fat ricotta or cottage cheese, or plain low-fat yogurt. Low-sodium or reduced-sodium cheeses. Fats and Oils Tub margarines without trans fats. Light or reduced-fat mayonnaise and salad dressings (reduced sodium). Avocado. Safflower, olive, or canola oils. Natural peanut or almond butter. Other Unsalted popcorn and pretzels. The items listed above may not be a complete list of recommended foods or beverages. Contact your dietitian for more options. WHAT FOODS ARE NOT RECOMMENDED? Grains White bread. White pasta. White rice. Refined cornbread. Bagels and croissants. Crackers that contain trans fat. Vegetables Creamed or fried vegetables. Vegetables in a cheese sauce. Regular canned vegetables. Regular canned tomato sauce and paste. Regular tomato and vegetable juices. Fruits Dried fruits. Canned fruit in light or heavy syrup. Fruit juice. Meat and Other Protein Products Fatty cuts of meat. Ribs, chicken wings, bacon, sausage, bologna, salami, chitterlings, fatback, hot dogs, bratwurst, and packaged luncheon meats. Salted nuts and seeds. Canned beans with salt. Dairy Whole or 2% milk, cream, half-and-half, and cream cheese. Whole-fat or sweetened yogurt. Full-fat   cheeses or blue cheese. Nondairy creamers and whipped toppings. Processed cheese, cheese spreads, or cheese  curds. Condiments Onion and garlic salt, seasoned salt, table salt, and sea salt. Canned and packaged gravies. Worcestershire sauce. Tartar sauce. Barbecue sauce. Teriyaki sauce. Soy sauce, including reduced sodium. Steak sauce. Fish sauce. Oyster sauce. Cocktail sauce. Horseradish. Ketchup and mustard. Meat flavorings and tenderizers. Bouillon cubes. Hot sauce. Tabasco sauce. Marinades. Taco seasonings. Relishes. Fats and Oils Butter, stick margarine, lard, shortening, ghee, and bacon fat. Coconut, palm kernel, or palm oils. Regular salad dressings. Other Pickles and olives. Salted popcorn and pretzels. The items listed above may not be a complete list of foods and beverages to avoid. Contact your dietitian for more information. WHERE CAN I FIND MORE INFORMATION? National Heart, Lung, and Blood Institute: www.nhlbi.nih.gov/health/health-topics/topics/dash/ Document Released: 07/01/2011 Document Revised: 11/26/2013 Document Reviewed: 05/16/2013 ExitCare Patient Information 2015 [employer]. This information is not intended to replace advice given to you by your health care provider. Make sure you discuss any questions you have with your health care provider. Diabetes Mellitus and Food It is important for you to manage your blood sugar (glucose) level. Your blood glucose level can be greatly affected by what you eat. Eating healthier foods in the appropriate amounts throughout the day at about the same time each day will help you control your blood glucose level. It can also help slow or prevent worsening of your diabetes mellitus. Healthy eating may even help you improve the level of your blood pressure and reach or maintain a healthy weight.  HOW CAN FOOD AFFECT ME? Carbohydrates Carbohydrates affect your blood glucose level more than any other type of food. Your dietitian will help you determine how many carbohydrates to eat at each meal and teach you how to count carbohydrates. Counting  carbohydrates is important to keep your blood glucose at a healthy level, especially if you are using insulin or taking certain medicines for diabetes mellitus. Alcohol Alcohol can cause sudden decreases in blood glucose (hypoglycemia), especially if you use insulin or take certain medicines for diabetes mellitus. Hypoglycemia can be a life-threatening condition. Symptoms of hypoglycemia (sleepiness, dizziness, and disorientation) are similar to symptoms of having too much alcohol.  If your health care provider has given you approval to drink alcohol, do so in moderation and use the following guidelines:  Women should not have more than one drink per day, and men should not have more than two drinks per day. One drink is equal to:  12 oz of beer.  5 oz of wine.  1 oz of hard liquor.  Do not drink on an empty stomach.  Keep yourself hydrated. Have water, diet soda, or unsweetened iced tea.  Regular soda, juice, and other mixers might contain a lot of carbohydrates and should be counted. WHAT FOODS ARE NOT RECOMMENDED? As you make food choices, it is important to remember that all foods are not the same. Some foods have fewer nutrients per serving than other foods, even though they might have the same number of calories or carbohydrates. It is difficult to get your body what it needs when you eat foods with fewer nutrients. Examples of foods that you should avoid that are high in calories and carbohydrates but low in nutrients include:  Trans fats (most processed foods list trans fats on the Nutrition Facts label).  Regular soda.  Juice.  Candy.  Sweets, such as cake, pie, doughnuts, and cookies.  Fried foods. WHAT FOODS CAN I EAT? Have nutrient-rich foods,   which will nourish your body and keep you healthy. The food you should eat also will depend on several factors, including:  The calories you need.  The medicines you take.  Your weight.  Your blood glucose level.  Your  blood pressure level.  Your cholesterol level. You also should eat a variety of foods, including:  Protein, such as meat, poultry, fish, tofu, nuts, and seeds (lean animal proteins are best).  Fruits.  Vegetables.  Dairy products, such as milk, cheese, and yogurt (low fat is best).  Breads, grains, pasta, cereal, rice, and beans.  Fats such as olive oil, trans fat-free margarine, canola oil, avocado, and olives. DOES EVERYONE WITH DIABETES MELLITUS HAVE THE SAME MEAL PLAN? Because every person with diabetes mellitus is different, there is not one meal plan that works for everyone. It is very important that you meet with a dietitian who will help you create a meal plan that is just right for you. Document Released: 04/08/2005 Document Revised: 07/17/2013 Document Reviewed: 06/08/2013 ExitCare Patient Information 2015 [employer]. This information is not intended to replace advice given to you by your health care provider. Make sure you discuss any questions you have with your health care provider.  

## 2015-02-25 NOTE — Progress Notes (Signed)
Patient here for follow up on her hypertension and for a refill on her medication

## 2015-03-24 ENCOUNTER — Ambulatory Visit
Admission: RE | Admit: 2015-03-24 | Discharge: 2015-03-24 | Disposition: A | Discharge status: Home / Self Care | Payer: No Typology Code available for payment source | Source: Ambulatory Visit

## 2015-03-24 DIAGNOSIS — Z803 Family history of malignant neoplasm of breast: Secondary | ICD-10-CM

## 2015-03-24 DIAGNOSIS — Z1231 Encounter for screening mammogram for malignant neoplasm of breast: Secondary | ICD-10-CM

## 2015-04-14 ENCOUNTER — Encounter: Payer: Self-pay | Admitting: Obstetrics & Gynecology

## 2015-04-14 DIAGNOSIS — N926 Irregular menstruation, unspecified: Secondary | ICD-10-CM

## 2015-04-15 ENCOUNTER — Telehealth: Payer: Self-pay

## 2015-04-15 NOTE — Telephone Encounter (Signed)
Returned patient phone call Patient states went to [place]  To see ob-gyn She was in the system but they did not know who she was supposed to be  Seeing Patient needs to have another appt.

## 2015-04-18 ENCOUNTER — Telehealth: Payer: Self-pay

## 2015-04-18 NOTE — Telephone Encounter (Signed)
Called patient to follow up for [facility] and no [facility] in over year. Patient had a Mammogram 03/24/15 and was self pay. When questioned patient did nor know that could of come back to [facility] or done a mammogram scholarship. All explained to patient. Discussed with patient why she had not done eligibility for [facility] card or [facility] patient assistance. Will send patient materials on eligibility. Will place patient inactive in [place] due to does not qualify do to not enrolled in [facility].

## 2015-05-05 ENCOUNTER — Ambulatory Visit (INDEPENDENT_AMBULATORY_CARE_PROVIDER_SITE_OTHER): Payer: Self-pay | Admitting: Obstetrics & Gynecology

## 2015-05-05 ENCOUNTER — Encounter: Payer: Self-pay | Admitting: Obstetrics & Gynecology

## 2015-05-05 VITALS — BP 140/85 | HR 79 | Ht 64.0 in | Wt 247.0 lb

## 2015-05-05 DIAGNOSIS — D500 Iron deficiency anemia secondary to blood loss (chronic): Secondary | ICD-10-CM

## 2015-05-05 DIAGNOSIS — N920 Excessive and frequent menstruation with regular cycle: Secondary | ICD-10-CM

## 2015-05-05 NOTE — Progress Notes (Signed)
   Subjective:    Patient ID: Kathryn Barrett, female    DOB: 03/06/1971, 44 y.o.   MRN: [id number]  HPI  44 yo S AAG0 referred here by [facility] for menorrhagia and anemia. She reports that since menarche at about 44 yo she has had very heavy 7-8 day periods. She does not use contraception currently but used OCPs in the distant past. She is sexually active   Review of Systems She reports that her pap smear is due as is her mammogram.    Objective:   Physical Exam Moderately obese BFNAD Breathing, conversing, and ambulating normally She refuses an exam today       Assessment & Plan:  Menorrhagia with regular cycle and anemia- check CBC,TSH and gyn u/s Rec pap at her convenience (she pays out of pocket and would prefer the price at the health dept) RTC 4 weeks

## 2015-09-10 MED FILL — AMLODIPINE BESYLATE 2.5 MG: 2.5 | 30 days supply | Qty: 30 | Fill #4

## 2015-10-21 ENCOUNTER — Emergency Department (HOSPITAL_BASED_OUTPATIENT_CLINIC_OR_DEPARTMENT_OTHER)
Admission: EM | Admit: 2015-10-21 | Discharge: 2015-10-21 | Disposition: A | Discharge status: Home / Self Care | Payer: Self-pay | Attending: Emergency Medicine | Admitting: Emergency Medicine

## 2015-10-21 ENCOUNTER — Encounter (HOSPITAL_BASED_OUTPATIENT_CLINIC_OR_DEPARTMENT_OTHER): Payer: Self-pay | Admitting: *Deleted

## 2015-10-21 DIAGNOSIS — I10 Essential (primary) hypertension: Secondary | ICD-10-CM | POA: Insufficient documentation

## 2015-10-21 DIAGNOSIS — Z79899 Other long term (current) drug therapy: Secondary | ICD-10-CM | POA: Insufficient documentation

## 2015-10-21 DIAGNOSIS — L03114 Cellulitis of left upper limb: Secondary | ICD-10-CM | POA: Insufficient documentation

## 2015-10-21 MED ORDER — CLINDAMYCIN HCL 150 MG PO CAPS: 450.0000 mg | ORAL_CAPSULE | Freq: Four times a day (QID) | ORAL | Status: DC

## 2015-10-21 NOTE — ED Provider Notes (Signed)
CSN: [id number]     Arrival date & time 10/21/15  1909 History   First MD Initiated Contact with Patient 10/21/15 2111     Chief Complaint  Patient presents with  . Insect Bite   HPI   45 year old female presents today with complaints of swelling to the left arm. Patient reports that 2 days ago she noticed redness around her left forearm, woke up this morning with purulent drainage out of the arm with spreading surrounding redness. Patient is uncertain if she was bitten by an insect, denies any trauma. Patient denies any significant past medical history of infection, and denies any IV drug use. Patient denies diabetes. Patient denies fever, chills, nausea, vomiting, any other infections. She has not tried any medications prior to arrival.    Past Medical History  Diagnosis Date  . Hypertension    History reviewed. No pertinent past surgical history. Family History  Problem Relation Age of Onset  . Breast cancer Mother   . Diabetes Maternal Grandmother   . Diabetes Maternal Grandfather   . Diabetes Paternal Grandmother   . Diabetes Paternal Grandfather    Social History  Substance Use Topics  . Smoking status: Never Smoker   . Smokeless tobacco: Never Used  . Alcohol Use: No   OB History    Gravida Para Term Preterm AB TAB SAB Ectopic Multiple Living   0              Review of Systems  All other systems reviewed and are negative.   Allergies  Review of patient's allergies indicates no known allergies.  Home Medications   Prior to Admission medications   Medication Sig Start Date End Date Taking? Authorizing Provider  amLODipine (NORVASC) 2.5 MG tablet Take 1 tablet (2.5 mg total) by mouth daily. 02/25/15   Doris Cheadle, MD  clindamycin (CLEOCIN) 150 MG capsule Take 3 capsules (450 mg total) by mouth 4 (four) times daily. 10/21/15   Eyvonne Mechanic, PA-C  ferrous sulfate (FERROUSUL) 325 (65 FE) MG tablet Take 1 tablet (325 mg total) by mouth 3 (three) times daily with  meals. 02/25/15   Doris Cheadle, MD   BP 173/100 mmHg  Pulse 87  Temp(Src) 98.1 F (36.7 C) (Oral)  Resp 18  Wt 112.038 kg  SpO2 100%  LMP 10/11/2015   Physical Exam  Constitutional: She is oriented to person, place, and time. She appears well-developed and well-nourished.  HENT:  Head: Normocephalic and atraumatic.  Eyes: Conjunctivae are normal. Pupils are equal, round, and reactive to light. Right eye exhibits no discharge. Left eye exhibits no discharge. No scleral icterus.  Neck: Normal range of motion. No JVD present. No tracheal deviation present.  Pulmonary/Chest: Effort normal. No stridor.  Neurological: She is alert and oriented to person, place, and time. Coordination normal.  Skin: Skin is warm and dry.  The area of redness, warmth to touch, induration to the left proximal forearm.  Area of what appears to be drained superficial abscess  Psychiatric: She has a normal mood and affect. Her behavior is normal. Judgment and thought content normal.  Nursing note and vitals reviewed.   ED Course  Procedures (including critical care time)  EMERGENCY DEPARTMENT US SOFT TISSUE INTERPRETATION "Study: Limited Ultrasound of the noted body part in comments below"  INDICATIONS: Soft tissue infection Multiple views of the body part are obtained with a multi-frequency linear probe  PERFORMED BY:  Myself  IMAGES ARCHIVED?: Yes  SIDE:Left  BODY PART:Upper extremity  FINDINGS:  Cellulitis present  LIMITATIONS:  Body Habitus  INTERPRETATION:  No abcess noted and Cellulitis present  COMMENT:  No abscess, cellulitis noted   Labs Review Labs Reviewed - No data to display  Imaging Review No results found. I have personally reviewed and evaluated these images and lab results as part of my medical decision-making.   EKG Interpretation None      MDM   Final diagnoses:  Cellulitis of left upper extremity   Labs:  Imaging:  Consults:  Therapeutics:  Discharge  Meds: Clindamycin  Assessment/Plan: 45 year old female presents today with cellulitis of the left upper extremity. Patient is afebrile, nontoxic in no acute distress. Ultrasound shows no amenable abscess to drainage. Patient does note that she did have some drainage earlier in the day. Patient chart shows history prediabetes, patient denies this reporting she's never had elevated blood sugar levels. Patient will be discharged home with an MI 7, close follow-up with primary care provider in 2 days for reevaluation. She is instructed return immediately to emergency room if any new or worsening signs or symptoms present. Patient verbalized understanding and agreement today's plan had no further questions concerns at time of discharge        Eyvonne Mechanic, PA-C 10/21/15 2232  Pricilla Loveless, MD 10/23/15 1524

## 2015-10-21 NOTE — ED Notes (Signed)
Possible insect bite to her left forearm. Red, hot, swollen, and painful. It has drainage coming from the site.

## 2015-10-21 NOTE — Discharge Instructions (Signed)

## 2015-10-23 MED FILL — AMLODIPINE BESYLATE 2.5 MG: 2.5 | 30 days supply | Qty: 30 | Fill #5

## 2015-11-03 ENCOUNTER — Inpatient Hospital Stay: Payer: Self-pay | Admitting: Family Medicine

## 2015-11-27 MED FILL — AMLODIPINE BESYLATE 2.5 MG: 2.5 | 30 days supply | Qty: 30 | Fill #6

## 2016-01-09 MED FILL — AMLODIPINE BESYLATE 2.5 MG: 2.5 | 30 days supply | Qty: 30 | Fill #7

## 2016-01-09 MED FILL — FERROUS SULFATE 325 MG TAB: 325 (65 FE) | 30 days supply | Qty: 90 | Fill #2

## 2016-02-19 MED FILL — AMLODIPINE BESYLATE 2.5 MG: 2.5 | 30 days supply | Qty: 30 | Fill #8

## 2016-02-29 ENCOUNTER — Encounter (HOSPITAL_BASED_OUTPATIENT_CLINIC_OR_DEPARTMENT_OTHER): Payer: Self-pay | Admitting: *Deleted

## 2016-02-29 ENCOUNTER — Emergency Department (HOSPITAL_BASED_OUTPATIENT_CLINIC_OR_DEPARTMENT_OTHER)
Admission: EM | Admit: 2016-02-29 | Discharge: 2016-02-29 | Disposition: A | Discharge status: Home / Self Care | Payer: Self-pay | Attending: Emergency Medicine | Admitting: Emergency Medicine

## 2016-02-29 DIAGNOSIS — R209 Unspecified disturbances of skin sensation: Secondary | ICD-10-CM | POA: Insufficient documentation

## 2016-02-29 DIAGNOSIS — Z79899 Other long term (current) drug therapy: Secondary | ICD-10-CM | POA: Insufficient documentation

## 2016-02-29 DIAGNOSIS — R202 Paresthesia of skin: Secondary | ICD-10-CM

## 2016-02-29 DIAGNOSIS — I10 Essential (primary) hypertension: Secondary | ICD-10-CM | POA: Insufficient documentation

## 2016-02-29 NOTE — Discharge Instructions (Signed)
It is recommended to wear compression stockings to help with the swelling

## 2016-02-29 NOTE — ED Triage Notes (Signed)
Patient states she has swelling in the left foot for the last 2-3 weeks.  States the swelling is associated with mild numbness.  States the numbness now radiates up to her left knee.  States she was switched doses on her B/P meds several months ago, and when she contacted the office, her doctor had left and she has not heard back from them.  Continues to take the new dosage.  Patient ambulated to triage with no difficulty or distress.

## 2016-02-29 NOTE — ED Notes (Signed)
Pt reports swelling to L leg x several months. Denies injury. Reports some periods of numbness although none currently. +pulse +cms

## 2016-02-29 NOTE — ED Notes (Signed)
PA at bedside.

## 2016-02-29 NOTE — ED Provider Notes (Signed)
[facility] Note   CSN: [id number] Arrival date & time: 02/29/16  1533  First Provider Contact:  First MD Initiated Contact with Patient 02/29/16 1707       By signing my name below, I, Doreatha Martin, attest that this documentation has been prepared under the direction and in the presence of  Kalysta Kneisley, PA-C. Electronically Signed: Doreatha Martin, ED Scribe. 02/29/16. 5:15 PM.   History   Chief Complaint Chief Complaint  Patient presents with  . Numbness    left foot and lower leg    HPI Kathryn Barrett is a 45 y.o. female who presents to the Emergency Department complaining of moderate, gradually worsening subjective numbness to the left foot onset at 1 pm this afternoon. Per pt, she felt as though her whole foot was numb before the numbness felt like it began to extend from her foot up her leg to her knee. She reports that the numbness has improved and she currently only feels numbness in her toes. She also complains of left foot swelling for a month. No h/o of similar symptoms. No FHx of MS. No Hx of PE/DVT, recent long travel, surgery, fracture, prolonged immobilization. She denies numbness to the right leg, pain to BLE, CP, SOB, weakness to BLE, HA, vision changes, difficulty swallowing or breathing, additional complaints.    The history is provided by the patient. No language interpreter was used.    Past Medical History:  Diagnosis Date  . Hypertension     Patient Active Problem List   Diagnosis Date Noted  . Anemia due to chronic blood loss 05/05/2015  . Prediabetes 01/01/2014  . Essential hypertension, benign 01/01/2014  . Menorrhagia 01/01/2014  . Breast lump on left side at 6 o'clock position 07/31/2013    History reviewed. No pertinent surgical history.  OB History    Gravida Para Term Preterm AB Living   0             SAB TAB Ectopic Multiple Live Births                   Home Medications    Prior to Admission medications     Medication Sig Start Date End Date Taking? Authorizing Provider  amLODipine (NORVASC) 2.5 MG tablet Take 1 tablet (2.5 mg total) by mouth daily. 02/25/15  Yes Doris Cheadle, MD  ferrous sulfate (FERROUSUL) 325 (65 FE) MG tablet Take 1 tablet (325 mg total) by mouth 3 (three) times daily with meals. 02/25/15  Yes Doris Cheadle, MD  clindamycin (CLEOCIN) 150 MG capsule Take 3 capsules (450 mg total) by mouth 4 (four) times daily. 10/21/15   Eyvonne Mechanic, PA-C    Family History Family History  Problem Relation Age of Onset  . Breast cancer Mother   . Diabetes Maternal Grandmother   . Diabetes Maternal Grandfather   . Diabetes Paternal Grandmother   . Diabetes Paternal Grandfather     Social History Social History  Substance Use Topics  . Smoking status: Never Smoker  . Smokeless tobacco: Never Used  . Alcohol use No     Allergies   Review of patient's allergies indicates no known allergies.   Review of Systems Review of Systems  HENT: Negative for trouble swallowing.   Eyes: Negative for visual disturbance.  Respiratory: Negative for shortness of breath.   Cardiovascular: Positive for leg swelling. Negative for chest pain.  Musculoskeletal: Negative for myalgias.  Neurological: Positive for numbness. Negative for weakness and headaches.  Physical Exam Updated Vital Signs BP 138/97 (BP Location: Left Arm)   Pulse 89   Temp 98.7 F (37.1 C) (Oral)   Resp 18   Ht  (1.626 m)   Wt 260 lb (117.9 kg)   LMP 02/25/2016 (Exact Date)   SpO2 100%   BMI 44.63 kg/m   Physical Exam  Constitutional: She appears well-developed and well-nourished.  HENT:  Head: Normocephalic.  Eyes: Conjunctivae are normal. Pupils are equal, round, and reactive to light.  Cardiovascular: Normal rate, regular rhythm and normal heart sounds.   No murmur heard. DP pulses 2+ and equal bilaterally.   Pulmonary/Chest: Effort normal and breath sounds normal. No respiratory distress. She has  no wheezes. She has no rales.  Lungs CTA bilaterally.   Musculoskeletal: Normal range of motion. She exhibits edema.  Bilateral pitting edema of the feet and ankles, slightly worse on the left. There is no erythema or warmth.   Neurological: She is alert. No cranial nerve deficit.  Distal sensation of both feet is intact. Muscle strength 5/5 to the lower extremities bilaterally. Grip strength 5/5 bilaterally. Cranial nerves 2-12 intact.   Skin: Skin is warm and dry. No erythema.  Psychiatric: She has a normal mood and affect. Her behavior is normal.  Nursing note and vitals reviewed.    ED Treatments / Results  Labs (all labs ordered are listed, but only abnormal results are displayed) Labs Reviewed - No data to display  EKG  EKG Interpretation None       Radiology No results found.  Procedures Procedures (including critical care time)  DIAGNOSTIC STUDIES: Oxygen Saturation is 100% on RA, normal by my interpretation.    COORDINATION OF CARE: 5:15 PM Discussed treatment plan with pt at bedside and pt agreed to plan.    Medications Ordered in ED Medications - No data to display   Initial Impression / Assessment and Plan / ED Course  I have reviewed the triage vital signs and the nursing notes.  Pertinent labs & imaging results that were available during my care of the patient were reviewed by me and considered in my medical decision making (see chart for details).  Clinical Course    Kathryn Barrett presents to the ED for evaluation of unilateral numbness of her LLE.  She states that the numbness is now improving and she only feels it in her foot.  No history of DM.  No back pain.  She has a normal neurological exam.  Sensation is intact on exam.  No weakness.  No signs of infection.   Conservative therapies discussed and recommended. Patient advised to follow up with PCP as needed and also given referral to Neurology. Patient discussed with Dr Verdie Mosher who is in agreement  with the plan.  Patient appears stable for discharge at this time. Return precautions discussed and outlined in discharge paperwork. Patient is agreeable to plan.     Final Clinical Impressions(s) / ED Diagnoses   Final diagnoses:  None    New Prescriptions New Prescriptions   No medications on file    I personally performed the services described in this documentation, which was scribed in my presence. The recorded information has been reviewed and is accurate.    Santiago Glad, PA-C 03/03/16 2223    Lavera Guise, MD 03/04/16 2137

## 2016-03-09 ENCOUNTER — Ambulatory Visit (INDEPENDENT_AMBULATORY_CARE_PROVIDER_SITE_OTHER): Payer: Self-pay | Admitting: Neurology

## 2016-03-09 ENCOUNTER — Encounter: Payer: Self-pay | Admitting: Neurology

## 2016-03-09 VITALS — BP 160/94 | HR 94 | Ht 64.0 in | Wt 259.0 lb

## 2016-03-09 DIAGNOSIS — R200 Anesthesia of skin: Secondary | ICD-10-CM

## 2016-03-09 DIAGNOSIS — R208 Other disturbances of skin sensation: Secondary | ICD-10-CM

## 2016-03-09 NOTE — Patient Instructions (Signed)
I had a long discussion with the patient and her mother regarding the intermittent left leg and foot numbness and discussed differential diagnosis, plan for evaluation and answered questions. I recommend checking EMG nerve conduction study as well as MRI scan of the brain and lumbar spine. No specific treatment is indicated at the present time and this numbness returns and is disabling enough. She will return for follow-up in 2 months or call earlier if necessary.

## 2016-03-10 NOTE — Progress Notes (Signed)
[facility] [address] [place]. [place] [address] [phone]       OFFICE CONSULT NOTE  Ms. Kathryn Barrett Date of Birth:  January 28, 1971 Medical Record Number:  [id number]   Referring MD: Dr Verdie Mosher  Reason for Referral:  Leg numbness HPI: 73 year young [place] American lady is seen today for initial  office consultation. She is accompanied today by her mother. She states he developed sudden onset of left foot numbness on 02/29/16. Patient started in the bottom of the foot and over 1 minute spread to involve the left leg to below the knee. This lasted about an hour and a half and gradually went away. She was able to walk but The foot was numb and had to be careful. She denied any fall, back pain, radicular pain or injury. She had somewhat similar but milder episode 3 days ago when only the bottom of the foot felt numb this lasted only a few minutes and resolved. She is unable to identify specific triggers. She denies any habit of having chills, sweating cross legged. She has had no previous back problems of degenerative spine disease. She is pretty healthy except mild anemia and hypertension which appears to be well controlled. She denies any headaches, loss of vision, blurred vision, double vision, vertigo, gait or balance problems. She has no history of strokes, TIA, seizures, significant head injury or loss of consciousness. She has had no recent weight or appetite changes.  ROS:   14 system review of systems is positive for  numbness, tingling, leg swelling and all other systems negative  PMH:  Past Medical History:  Diagnosis Date  . Hypertension     Social History:  Social History   Social History  . Marital status: Single    Spouse name: N/A  . Number of children: N/A  . Years of education: N/A   Occupational History  . Not on file.   Social History Main Topics  . Smoking status: Never Smoker  . Smokeless tobacco: Never Used  . Alcohol use No  . Drug  use: No  . Sexual activity: Yes    Partners: Male    Birth control/ protection: None   Other Topics Concern  . Not on file   Social History Narrative  . No narrative on file    Medications:   Current Outpatient Prescriptions on File Prior to Visit  Medication Sig Dispense Refill  . amLODipine (NORVASC) 2.5 MG tablet Take 1 tablet (2.5 mg total) by mouth daily. 90 tablet 3  . ferrous sulfate (FERROUSUL) 325 (65 FE) MG tablet Take 1 tablet (325 mg total) by mouth 3 (three) times daily with meals. 90 tablet 3   No current facility-administered medications on file prior to visit.     Allergies:  No Known Allergies  Physical Exam General: Obese young African-American lady, seated, in no evident distress Head: head normocephalic and atraumatic.   Neck: supple with no carotid or supraclavicular bruits Cardiovascular: regular rate and rhythm, no murmurs Musculoskeletal: no deformity Skin:  no rash/petichiae Vascular:  Normal pulses all extremities  Neurologic Exam Mental Status: Awake and fully alert. Oriented to place and time. Recent and remote memory intact. Attention span, concentration and fund of knowledge appropriate. Mood and affect appropriate.  Cranial Nerves: Fundoscopic exam reveals sharp disc margins. Pupils equal, briskly reactive to light. Extraocular movements full without nystagmus. Visual fields full to confrontation. Hearing intact. Facial sensation intact. Face, tongue, palate moves normally and symmetrically.  Motor: Normal  bulk and tone. Normal strength in all tested extremity muscles. Straight leg raising test is negative Sensory.: intact to touch , pinprick , position and vibratory sensation. Tinel's sign is negative over both fibular condyle Coordination: Rapid alternating movements normal in all extremities. Finger-to-nose and heel-to-shin performed accurately bilaterally. Gait and Station: Arises from chair without difficulty. Stance is normal. Gait  demonstrates normal stride length and balance . Able to heel, toe and tandem walk without difficulty.  Reflexes: 1+ and symmetric. Toes downgoing.       ASSESSMENT: 45 year old obese young African-American lady with intermittent left foot and leg numbness of unclear etiology. Possibilities include paternal neuropathy versus lumbar radiculopathy. Central cause like demyelinating disease is less likely    PLAN: I had a long discussion with the patient and her mother regarding the intermittent left leg and foot numbness and discussed differential diagnosis, plan for evaluation and answered questions. I recommend checking EMG nerve conduction study as well as MRI scan of the brain and lumbar spine. No specific treatment is indicated at the present time and this numbness returns and is disabling enough. Greater than 50% time during this 45 minute consultation visit was spent on counseling and coordination of care about his leg paresthesias. She will return for follow-up in 2 months or call earlier if necessary. Kathryn HeadyPramod Estil Vallee, MD  [facility] [address] [place], [place] [id number]  Phone [phone] Fax [phone] Note: This document was prepared with digital dictation and possible smart phrase technology. Any transcriptional errors that result from this process are unintentional.

## 2016-03-19 ENCOUNTER — Other Ambulatory Visit: Payer: Self-pay | Admitting: Internal Medicine

## 2016-03-19 DIAGNOSIS — Z1231 Encounter for screening mammogram for malignant neoplasm of breast: Secondary | ICD-10-CM

## 2016-03-22 ENCOUNTER — Other Ambulatory Visit: Payer: Self-pay | Admitting: Internal Medicine

## 2016-03-22 DIAGNOSIS — Z1231 Encounter for screening mammogram for malignant neoplasm of breast: Secondary | ICD-10-CM

## 2016-03-30 ENCOUNTER — Other Ambulatory Visit: Payer: Self-pay | Admitting: Pharmacist

## 2016-03-30 DIAGNOSIS — I10 Essential (primary) hypertension: Secondary | ICD-10-CM

## 2016-03-30 MED ORDER — AMLODIPINE BESYLATE 2.5 MG PO TABS: 2.5000 mg | ORAL_TABLET | Freq: Every day | ORAL | 0 refills | Status: DC

## 2016-04-05 ENCOUNTER — Ambulatory Visit: Payer: Self-pay | Attending: Family Medicine | Admitting: Family Medicine

## 2016-04-05 ENCOUNTER — Encounter: Payer: Self-pay | Admitting: Family Medicine

## 2016-04-05 VITALS — BP 147/85 | HR 84 | Temp 98.0°F | Resp 20 | Ht 64.0 in | Wt 259.0 lb

## 2016-04-05 DIAGNOSIS — R600 Localized edema: Secondary | ICD-10-CM

## 2016-04-05 DIAGNOSIS — I10 Essential (primary) hypertension: Secondary | ICD-10-CM

## 2016-04-05 MED ORDER — LISINOPRIL 10 MG PO TABS: 10.0000 mg | ORAL_TABLET | Freq: Every day | ORAL | 3 refills | Status: DC

## 2016-04-05 MED FILL — LISINOPRIL 10 MG TABLET: 10 | 30 days supply | Qty: 30 | Fill #0

## 2016-04-05 NOTE — Progress Notes (Signed)
   Subjective:  Patient ID: Kathryn Barrett, female    DOB: March 13, 1971  Age: 45 y.o. MRN: [id number]  CC: Hypertension   HPI Kathryn Barrett is a 45 year old female with a history of obesity, hypertension who presents to the clinic today to establish care with me. She complains of pedal edema which she noticed ever since her lisinopril was raised from 2.5 mg to 5 mg. And this concerns her and she would like to switch to another antihypertensive. She has no other concerns today.  Past Medical History:  Diagnosis Date  . Hypertension     No past surgical history on file.  No Known Allergies   Outpatient Medications Prior to Visit  Medication Sig Dispense Refill  . ferrous sulfate (FERROUSUL) 325 (65 FE) MG tablet Take 1 tablet (325 mg total) by mouth 3 (three) times daily with meals. 90 tablet 3  . amLODipine (NORVASC) 2.5 MG tablet Take 1 tablet (2.5 mg total) by mouth daily. 30 tablet 0   No facility-administered medications prior to visit.     ROS Review of Systems  Constitutional: Negative for activity change and appetite change.  HENT: Negative for sinus pressure and sore throat.   Respiratory: Negative for chest tightness, shortness of breath and wheezing.   Cardiovascular: Negative for chest pain and palpitations.  Gastrointestinal: Negative for abdominal distention, abdominal pain and constipation.  Genitourinary: Negative.   Musculoskeletal: Negative.   Psychiatric/Behavioral: Negative for behavioral problems and dysphoric mood.    Objective:  BP (!) 147/85 (BP Location: Left Arm, Patient Position: Sitting, Cuff Size: Large)   Pulse 84   Temp 98 F (36.7 C) (Oral)   Resp 20   Ht 5\' 4"  (1.626 m)   Wt 259 lb (117.5 kg)   SpO2 100%   BMI 44.46 kg/m   BP/Weight 04/05/2016 03/09/2016 02/29/2016  Systolic BP 147 160 143  Diastolic BP 85 94 92  Wt. (Lbs) 259 259 260  BMI 44.46 44.46 44.63      Physical Exam  Constitutional: She is oriented to person, place,  and time. She appears well-developed and well-nourished.  Cardiovascular: Normal rate, normal heart sounds and intact distal pulses.   No murmur heard. Pulmonary/Chest: Effort normal and breath sounds normal. She has no wheezes. She has no rales. She exhibits no tenderness.  Abdominal: Soft. Bowel sounds are normal. She exhibits no distension and no mass. There is no tenderness.  Musculoskeletal: Normal range of motion. She exhibits edema (1+ pitting pedal edema bilaterally).  Neurological: She is alert and oriented to person, place, and time.     Assessment & Plan:   1. Pedal edema Likely secondary to amlodipine which have discontinued Low-sodium, DASH diet Elevate legs  2. Essential hypertension, benign Blood pressure is slightly elevated. Commenced on lisinopril Low-sodium diet   Meds ordered this encounter  Medications  . lisinopril (PRINIVIL,ZESTRIL) 10 MG tablet    Sig: Take 1 tablet (10 mg total) by mouth daily.    Dispense:  30 tablet    Refill:  3    Discontinue Amlodipine    Follow-up: Return in about 1 month (around 05/05/2016) for complete physical.   Jaclyn Shaggy MD

## 2016-04-05 NOTE — Patient Instructions (Signed)

## 2016-04-05 NOTE — Progress Notes (Signed)
Need Bp med refill. Patient states since she has been taking 1 tablet she has had ankle edema.

## 2016-04-06 ENCOUNTER — Ambulatory Visit
Admission: RE | Admit: 2016-04-06 | Discharge: 2016-04-06 | Disposition: A | Discharge status: Home / Self Care | Payer: No Typology Code available for payment source | Source: Ambulatory Visit | Attending: Physician Assistant | Admitting: Physician Assistant

## 2016-04-06 DIAGNOSIS — Z1231 Encounter for screening mammogram for malignant neoplasm of breast: Secondary | ICD-10-CM

## 2016-05-07 ENCOUNTER — Encounter (HOSPITAL_BASED_OUTPATIENT_CLINIC_OR_DEPARTMENT_OTHER): Payer: Self-pay | Admitting: Emergency Medicine

## 2016-05-07 ENCOUNTER — Emergency Department (HOSPITAL_BASED_OUTPATIENT_CLINIC_OR_DEPARTMENT_OTHER)
Admission: EM | Admit: 2016-05-07 | Discharge: 2016-05-07 | Disposition: A | Discharge status: Home / Self Care | Payer: No Typology Code available for payment source | Attending: Emergency Medicine | Admitting: Emergency Medicine

## 2016-05-07 DIAGNOSIS — I10 Essential (primary) hypertension: Secondary | ICD-10-CM | POA: Insufficient documentation

## 2016-05-07 DIAGNOSIS — S40862A Insect bite (nonvenomous) of left upper arm, initial encounter: Secondary | ICD-10-CM | POA: Insufficient documentation

## 2016-05-07 DIAGNOSIS — Y929 Unspecified place or not applicable: Secondary | ICD-10-CM | POA: Insufficient documentation

## 2016-05-07 DIAGNOSIS — T148XXA Other injury of unspecified body region, initial encounter: Secondary | ICD-10-CM

## 2016-05-07 DIAGNOSIS — S40861A Insect bite (nonvenomous) of right upper arm, initial encounter: Secondary | ICD-10-CM | POA: Insufficient documentation

## 2016-05-07 DIAGNOSIS — Z79899 Other long term (current) drug therapy: Secondary | ICD-10-CM | POA: Insufficient documentation

## 2016-05-07 DIAGNOSIS — Y939 Activity, unspecified: Secondary | ICD-10-CM | POA: Insufficient documentation

## 2016-05-07 DIAGNOSIS — W57XXXA Bitten or stung by nonvenomous insect and other nonvenomous arthropods, initial encounter: Secondary | ICD-10-CM | POA: Insufficient documentation

## 2016-05-07 DIAGNOSIS — Y999 Unspecified external cause status: Secondary | ICD-10-CM | POA: Insufficient documentation

## 2016-05-07 DIAGNOSIS — S41151A Open bite of right upper arm, initial encounter: Secondary | ICD-10-CM | POA: Insufficient documentation

## 2016-05-07 MED ORDER — DIPHENHYDRAMINE HCL 25 MG PO TABS: 25.0000 mg | ORAL_TABLET | Freq: Four times a day (QID) | ORAL | 0 refills | Status: DC | PRN

## 2016-05-07 MED ORDER — HYDROCORTISONE 1 % EX CREA
1.0000 | TOPICAL_CREAM | Freq: Two times a day (BID) | CUTANEOUS | 1 refills | Status: DC
Start: 2016-05-07 — End: 2016-11-08

## 2016-05-07 NOTE — Discharge Instructions (Signed)
Take the prescribed medication as directed. Follow-up with your primary care doctor.  If not improving, you may need to see dermatologist. Return to the ED for new or worsening symptoms.

## 2016-05-07 NOTE — ED Triage Notes (Signed)
Pt c/o rash x 3 weeks

## 2016-05-07 NOTE — ED Provider Notes (Signed)
[facility] Note   CSN: [id number] Arrival date & time: 05/07/16  1922  By signing my name below, I, Kathryn Barrett, attest that this documentation has been prepared under the direction and in the presence of Kathryn Sites, PA-C. Electronically Signed: Angelene Barrett, ED Scribe. 05/07/16. 8:19 PM.   History   Chief Complaint Chief Complaint  Patient presents with  . Rash    HPI Comments: Kathryn Barrett is a 45 y.o. female with a hx of hypertension who presents to the Emergency Department complaining of gradual onset, persistent moderately itchy rash to her bilateral upper arms onset 3-4 weeks ago. She reports associated multiple "bumbs" to her left posterior scalp. She notes that there was a rash to the lateral aspect of her right thigh but that has since resolved on its own. Family member adds that pt has been having intermittent episodes of rash to multiple parts of her body but the rash resolves on its own. No alleviating factors noted. Pt has not tried any medications PTA. She states that she started taking Lisinopril approximately 4 weeks ago and expresses concerns that that could be contributing to her symptoms. She denies any known insect bites or tick exposure. She also denies any sick contacts at home or any other else with these symptoms. Pt has NKDA. She denies any fever, chills, nausea, vomiting, difficulty swallowing, lip/tongue swelling, or any other symptoms.   PCP: Dr. Venetia Night, [facility]  The history is provided by the patient. No language interpreter was used.    Past Medical History:  Diagnosis Date  . Hypertension     Patient Active Problem List   Diagnosis Date Noted  . Anemia due to chronic blood loss 05/05/2015  . Prediabetes 01/01/2014  . Essential hypertension, benign 01/01/2014  . Menorrhagia 01/01/2014  . Breast lump on left side at 6 o'clock position 07/31/2013    History reviewed. No pertinent surgical  history.  OB History    Gravida Para Term Preterm AB Living   0             SAB TAB Ectopic Multiple Live Births                   Home Medications    Prior to Admission medications   Medication Sig Start Date End Date Taking? Authorizing Provider  ferrous sulfate (FERROUSUL) 325 (65 FE) MG tablet Take 1 tablet (325 mg total) by mouth 3 (three) times daily with meals. 02/25/15   Doris Cheadle, MD  lisinopril (PRINIVIL,ZESTRIL) 10 MG tablet Take 1 tablet (10 mg total) by mouth daily. 04/05/16   Jaclyn Shaggy, MD    Family History Family History  Problem Relation Age of Onset  . Breast cancer Mother   . Diabetes Maternal Grandmother   . Diabetes Maternal Grandfather   . Diabetes Paternal Grandmother   . Diabetes Paternal Grandfather     Social History Social History  Substance Use Topics  . Smoking status: Never Smoker  . Smokeless tobacco: Never Used  . Alcohol use No     Allergies   Review of patient's allergies indicates no known allergies.   Review of Systems Review of Systems  Constitutional: Negative for chills and fever.  HENT: Negative for facial swelling.   Respiratory: Negative for shortness of breath.   Gastrointestinal: Negative for nausea and vomiting.  Skin: Positive for rash.  All other systems reviewed and are negative.    Physical Exam Updated Vital Signs BP 153/94 (BP  Location: Left Arm)   Pulse 88   Temp 97.9 F (36.6 C) (Oral)   Resp 16   Ht 5\' 4"  (1.626 m)   Wt 258 lb 9.6 oz (117.3 kg)   SpO2 100%   BMI 44.39 kg/m   Physical Exam  Constitutional: She is oriented to person, place, and time. She appears well-developed and well-nourished.  HENT:  Head: Normocephalic and atraumatic.  Mouth/Throat: Oropharynx is clear and moist.  No facial, tongue, lip, or neck swelling, handling secretions well, normal phonation without stridor  Eyes: Conjunctivae and EOM are normal. Pupils are equal, round, and reactive to light.  Neck: Normal range  of motion.  Cardiovascular: Normal rate, regular rhythm and normal heart sounds.   Pulmonary/Chest: Effort normal and breath sounds normal.  Abdominal: Soft. Bowel sounds are normal.  Musculoskeletal: Normal range of motion.  Neurological: She is alert and oriented to person, place, and time.  Skin: Skin is warm and dry.  2 small bug bite appearing lesions noted to bilateral upper arms which are pruritic; no surrounding cellulitis, no drainage or swelling  Psychiatric: She has a normal mood and affect.  Nursing note and vitals reviewed.    ED Treatments / Results  DIAGNOSTIC STUDIES: Oxygen Saturation is 100% on RA, normal by my interpretation.    COORDINATION OF CARE: 8:11 PM- Pt advised of plan for treatment and pt agrees. Will provide resources for dermatology follow up. Return precautions discussed.    Labs (all labs ordered are listed, but only abnormal results are displayed) Labs Reviewed - No data to display  EKG  EKG Interpretation None       Radiology No results found.  Procedures Procedures (including critical care time)  Medications Ordered in ED Medications - No data to display   Initial Impression / Assessment and Plan / ED Course  Kathryn Sites, PA-C has reviewed the triage vital signs and the nursing notes.  Pertinent labs & imaging results that were available during my care of the patient were reviewed by me and considered in my medical decision making (see chart for details).  Clinical Course   45 year old female who with rash. She has bug bite appearing lesions to her bilateral arms. These areas are pruritic. There are no signs of superimposed infection or cellulitis. Discussed with her that I feel these are likely some form of insect bite. Does not appear consistent with scabies or bedbugs. Patient expressed concern regarding her medications, notably lisinopril. She denies any sensation of lip/tongue/face/neck swelling.  No sensation of throat  closing, difficulty swallowing, or SOB.  No signs or symptoms concerning for angioedema at this time. Does not appear to be a medication reaction at this time.  Recommended benadryl and hydrocortisone cream.  Advised to monitor for any signs or symptoms concerning for angioedema. I recommended that she follow-up with her primary care doctor. She was also given referral to dermatology.  Discussed plan with patient, she acknowledged understanding and agreed with plan of care.  Return precautions given for new or worsening symptoms.  Final Clinical Impressions(s) / ED Diagnoses   Final diagnoses:  Bites    New Prescriptions Discharge Medication List as of 05/07/2016  8:36 PM    START taking these medications   Details  diphenhydrAMINE (BENADRYL) 25 MG tablet Take 1 tablet (25 mg total) by mouth every 6 (six) hours as needed for itching (Rash)., Starting Fri 05/07/2016, Print    hydrocortisone cream 1 % Apply 1 application topically 2 (two) times daily.  Do not apply to face, Starting Fri 05/07/2016, Print       I personally performed the services described in this documentation, which was scribed in my presence. The recorded information has been reviewed and is accurate.   Garlon HatchetLisa M Yvonda Fouty, PA-C 05/07/16 2053    Melene Planan Floyd, DO 05/07/16 2055

## 2016-05-19 MED FILL — LISINOPRIL 10 MG TABLET: 10 | 30 days supply | Qty: 30 | Fill #1

## 2016-05-21 ENCOUNTER — Encounter: Payer: Self-pay | Admitting: Neurology

## 2016-06-15 ENCOUNTER — Ambulatory Visit: Payer: Self-pay | Admitting: Neurology

## 2016-07-01 MED FILL — LISINOPRIL 10 MG TABLET: 10 | 30 days supply | Qty: 30 | Fill #2

## 2016-08-12 ENCOUNTER — Ambulatory Visit: Payer: Self-pay | Admitting: Neurology

## 2016-08-13 MED FILL — LISINOPRIL 10 MG TABLET: 10 | 30 days supply | Qty: 30 | Fill #3

## 2016-09-15 ENCOUNTER — Other Ambulatory Visit: Payer: Self-pay | Admitting: Family Medicine

## 2016-09-15 MED FILL — ?LISINOPRIL 10 MG TABLET: 10 | 30 days supply | Qty: 30 | Fill #0

## 2016-10-06 ENCOUNTER — Ambulatory Visit: Payer: Self-pay | Admitting: Neurology

## 2016-10-13 ENCOUNTER — Encounter: Payer: Self-pay | Admitting: Neurology

## 2016-10-15 ENCOUNTER — Other Ambulatory Visit: Payer: Self-pay | Admitting: Family Medicine

## 2016-10-15 MED FILL — ?LISINOPRIL 10 MG TABLET: 10 | 30 days supply | Qty: 30 | Fill #0

## 2016-11-07 ENCOUNTER — Encounter (HOSPITAL_COMMUNITY): Payer: Self-pay | Admitting: Emergency Medicine

## 2016-11-07 ENCOUNTER — Observation Stay (HOSPITAL_COMMUNITY)
Admission: EM | Admit: 2016-11-07 | Discharge: 2016-11-08 | Disposition: A | Discharge status: Home / Self Care | Payer: BLUE CROSS/BLUE SHIELD | Attending: Internal Medicine | Admitting: Internal Medicine

## 2016-11-07 DIAGNOSIS — D649 Anemia, unspecified: Secondary | ICD-10-CM

## 2016-11-07 DIAGNOSIS — R42 Dizziness and giddiness: Secondary | ICD-10-CM | POA: Insufficient documentation

## 2016-11-07 DIAGNOSIS — Z79899 Other long term (current) drug therapy: Secondary | ICD-10-CM | POA: Insufficient documentation

## 2016-11-07 DIAGNOSIS — I10 Essential (primary) hypertension: Secondary | ICD-10-CM | POA: Diagnosis not present

## 2016-11-07 DIAGNOSIS — N920 Excessive and frequent menstruation with regular cycle: Secondary | ICD-10-CM | POA: Insufficient documentation

## 2016-11-07 DIAGNOSIS — D500 Iron deficiency anemia secondary to blood loss (chronic): Secondary | ICD-10-CM | POA: Diagnosis not present

## 2016-11-07 LAB — BASIC METABOLIC PANEL
Anion gap: 8 (ref 5–15)
BUN: 7 mg/dL (ref 6–20)
CALCIUM: 9.2 mg/dL (ref 8.9–10.3)
CO2: 24 mmol/L (ref 22–32)
CREATININE: 0.8 mg/dL (ref 0.44–1.00)
Chloride: 107 mmol/L (ref 101–111)
GFR calc non Af Amer: >60 mL/min (ref >60)
Glucose, Bld: 98 mg/dL (ref 65–99)
Potassium: 4.1 mmol/L (ref 3.5–5.1)
SODIUM: 139 mmol/L (ref 135–145)

## 2016-11-07 LAB — CBC
HEMATOCRIT: 24.3 % — AB (ref 36.0–46.0)
Hemoglobin: 6.7 g/dL — CL (ref 12.0–15.0)
MCH: 15.2 pg — ABNORMAL LOW (ref 26.0–34.0)
MCHC: 27.6 g/dL — ABNORMAL LOW (ref 30.0–36.0)
MCV: 55 fL — AB (ref 78.0–100.0)
Platelets: 572 10*3/uL — ABNORMAL HIGH (ref 150–400)
RBC: 4.42 MIL/uL (ref 3.87–5.11)
RDW: 21.3 % — ABNORMAL HIGH (ref 11.5–15.5)
WBC: 11.9 10*3/uL — AB (ref 4.0–10.5)

## 2016-11-07 LAB — I-STAT TROPONIN, ED: Troponin i, poc: 0 ng/mL (ref 0.00–0.08)

## 2016-11-07 LAB — ABO/RH: ABO/RH(D): B POS

## 2016-11-07 LAB — CBG MONITORING, ED: Glucose-Capillary: 80 mg/dL (ref 65–99)

## 2016-11-07 LAB — I-STAT BETA HCG BLOOD, ED (MC, WL, AP ONLY): I-stat hCG, quantitative: <5 m[IU]/mL (ref <5)

## 2016-11-07 LAB — PREPARE RBC (CROSSMATCH)

## 2016-11-07 MED ORDER — LISINOPRIL 10 MG PO TABS
10.0000 mg | ORAL_TABLET | Freq: Every day | ORAL | Status: DC
  Administered 2016-11-08 (×2): 10 mg via ORAL
  Filled 2016-11-07 (×2): qty 1

## 2016-11-07 MED ORDER — SODIUM CHLORIDE 0.9 % IV SOLN
10.0000 mL/h | Freq: Once | INTRAVENOUS | Status: AC
  Administered 2016-11-07: 10 mL/h via INTRAVENOUS

## 2016-11-07 MED ORDER — ACETAMINOPHEN 650 MG RE SUPP: 650.0000 mg | Freq: Four times a day (QID) | RECTAL | Status: DC | PRN

## 2016-11-07 MED ORDER — FERROUS SULFATE 325 (65 FE) MG PO TABS
325.0000 mg | ORAL_TABLET | Freq: Three times a day (TID) | ORAL | Status: DC
  Administered 2016-11-08 (×2): 325 mg via ORAL
  Filled 2016-11-07 (×2): qty 1

## 2016-11-07 MED ORDER — SODIUM CHLORIDE 0.9 % IV SOLN
INTRAVENOUS | Status: DC
  Administered 2016-11-08: 01:00:00 via INTRAVENOUS

## 2016-11-07 MED ORDER — ACETAMINOPHEN 325 MG PO TABS
650.0000 mg | ORAL_TABLET | Freq: Four times a day (QID) | ORAL | Status: DC | PRN
Start: 2016-11-07 — End: 2016-11-08
  Administered 2016-11-08: 650 mg via ORAL
  Filled 2016-11-07: qty 2

## 2016-11-07 NOTE — ED Triage Notes (Signed)
Per EMS, pt is coming from home after experiencing two near syncopal episodes. Pt reports feeling light headed and dizzy for approximately 2 hours. EMS stated pt was positive for orthostatic hypotension. Per EMS pt denies LOC, pain, SOB, and n/v/d. Pt stated she laid down to keep herself from losing consciousness. Pt states this has happened before in the last month since she started taking lisinopril. Pt also stated that she has developed a non-productive cough in the last week.

## 2016-11-07 NOTE — ED Provider Notes (Signed)
[facility]-EMERGENCY DEPT Provider Note   CSN: [id number] Arrival date & time: 11/07/16  1209     History   Chief Complaint Chief Complaint  Patient presents with  . Near Syncope    HPI Kathryn Barrett is a 46 y.o. female brought in by EMS who presents with intermittent dizziness that began yesterday. Patient describes it both as "room spinning and feeling off balance." She has not had difficulty ambulating since symptom onset. She states that her symptoms are improved with laying down flat and worsened with getting up. She does not see any change in symptoms with positional movement of her head. She called EMS today because the dizziness worsened and she felt very lightheaded. She denies any LOC. She denies any recent fevers or illness. She has a history of anemia secondary to menorrhagia. She is supposed to be on Iron supplements but has not been taking them. She has been monitoring her blood pressure frequently and states for the past week it has been averaging SBP 120s. She denies any low BP readings.  She denies any CP, SOB, fevers, headache, numbness/weakness, nausea/vomiting.   The history is provided by the patient.    Past Medical History:  Diagnosis Date  . Hypertension     Patient Active Problem List   Diagnosis Date Noted  . Anemia 11/07/2016  . Anemia due to chronic blood loss 05/05/2015  . Prediabetes 01/01/2014  . Essential hypertension, benign 01/01/2014  . Menorrhagia 01/01/2014  . Breast lump on left side at 6 o'clock position 07/31/2013    History reviewed. No pertinent surgical history.  OB History    Gravida Para Term Preterm AB Living   0             SAB TAB Ectopic Multiple Live Births                   Home Medications    Prior to Admission medications   Medication Sig Start Date End Date Taking? Authorizing Provider  ferrous sulfate (FERROUSUL) 325 (65 FE) MG tablet Take 1 tablet (325 mg total) by mouth 3 (three) times daily with meals. 02/25/15   Yes Deepak Advani, MD  lisinopril (PRINIVIL,ZESTRIL) 10 MG tablet TAKE 1 TABLET BY MOUTH DAILY. 10/15/16  Yes Jaclyn Shaggy, MD  diphenhydrAMINE (BENADRYL) 25 MG tablet Take 1 tablet (25 mg total) by mouth every 6 (six) hours as needed for itching (Rash). Patient not taking: Reported on 11/07/2016 05/07/16   Garlon Hatchet, PA-C  hydrocortisone cream 1 % Apply 1 application topically 2 (two) times daily. Do not apply to face Patient not taking: Reported on 11/07/2016 05/07/16   Garlon Hatchet, PA-C    Family History Family History  Problem Relation Age of Onset  . Breast cancer Mother   . Diabetes Maternal Grandmother   . Diabetes Maternal Grandfather   . Diabetes Paternal Grandmother   . Diabetes Paternal Grandfather     Social History Social History  Substance Use Topics  . Smoking status: Never Smoker  . Smokeless tobacco: Never Used  . Alcohol use No     Allergies   Patient has no known allergies.   Review of Systems Review of Systems  Constitutional: Negative for chills and fever.  HENT: Negative for congestion, rhinorrhea and sore throat.   Respiratory: Negative for cough and shortness of breath.   Cardiovascular: Negative for chest pain.  Gastrointestinal: Negative for abdominal pain, blood in stool, diarrhea, nausea and vomiting.  Genitourinary: Negative for dysuria,  hematuria and vaginal bleeding.  Musculoskeletal: Negative for back pain.  Skin: Negative for rash.  Neurological: Positive for dizziness. Negative for weakness, numbness and headaches.  All other systems reviewed and are negative.    Physical Exam Updated Vital Signs BP (!) 143/92 (BP Location: Right Arm)   Pulse 86   Temp 98.9 F (37.2 C) (Oral)   Resp 16   SpO2 100%   Physical Exam  Constitutional: She is oriented to person, place, and time. She appears well-developed and well-nourished.  HENT:  Head: Normocephalic and atraumatic.  Mouth/Throat: Oropharynx is clear and moist. Mucous  membranes are dry.  Eyes: Conjunctivae, EOM and lids are normal. Pupils are equal, round, and reactive to light. Right eye exhibits no nystagmus. Left eye exhibits no nystagmus.  Neck: Full passive range of motion without pain.  Cardiovascular: Normal rate, regular rhythm, normal heart sounds and normal pulses.  Exam reveals no gallop and no friction rub.   No murmur heard. Pulmonary/Chest: Effort normal and breath sounds normal.  Abdominal: Soft. Normal appearance. There is no tenderness. There is no rigidity and no guarding.  Musculoskeletal: Normal range of motion.  Neurological: She is alert and oriented to person, place, and time.  Follows commands, Moves all extremities  5/5 strength to BUE and BLE  Sensation intact throughout  Normal finger to nose. No dysdiadochokinesia. No pronator drift. No slurred speech. No facial droop.   Skin: Skin is warm and dry. Capillary refill takes less than 2 seconds.  Psychiatric: She has a normal mood and affect. Her speech is normal.  Nursing note and vitals reviewed.    ED Treatments / Results  Labs (all labs ordered are listed, but only abnormal results are displayed) Labs Reviewed  CBC - Abnormal; Notable for the following:       Result Value   WBC 11.9 (*)    Hemoglobin 6.7 (*)    HCT 24.3 (*)    MCV 55.0 (*)    MCH 15.2 (*)    MCHC 27.6 (*)    RDW 21.3 (*)    Platelets 572 (*)    All other components within normal limits  BASIC METABOLIC PANEL  I-STAT TROPOININ, ED  I-STAT BETA HCG BLOOD, ED ([facility], [facility], [facility] ONLY)  I-STAT CHEM 8, ED  CBG MONITORING, ED  TYPE AND SCREEN  PREPARE RBC (CROSSMATCH)  ABO/RH    EKG  EKG Interpretation  Date/Time:  Sunday November 07 2016 12:46:30 EDT Ventricular Rate:  86 PR Interval:    QRS Duration: 90 QT Interval:  379 QTC Calculation: 454 R Axis:   21 Text Interpretation:  Sinus rhythm Minimal ST elevation, anterior leads Confirmed by Lincoln Brigham [phone]) on 11/07/2016 1:33:40 PM        Radiology No results found.  Procedures Procedures (including critical care time)  Medications Ordered in ED Medications  0.9 %  sodium chloride infusion (10 mL/hr Intravenous New Bag/Given 11/07/16 1712)     Initial Impression / Assessment and Plan / ED Course  I have reviewed the triage vital signs and the nursing notes.  Pertinent labs & imaging results that were available during my care of the patient were reviewed by me and considered in my medical decision making (see chart for details).    46 yo presents via EMS for dizziness. Patient was +orthostatic hypotensive for EMS. Given fluid bolus en route, which patient states improved symptoms. Consider orthostatic hypotension vs hypoglycemia vs anemia vertigo. Also consider hyvs potension secondary to hypertensive  medication. Plan to check CBC eval Hgb/Hct, EKG, and FSBG.   EKG reviewed. Slight ST elevation in anterior leads which is a change from 2016 EKG. Will check Troponin.    Labs reviewed. Trop and beta Hcg negative. CBC shows Hgb/Hct: 6.7/24.3. Review of records show that patient's Hgb has previously been around 7.8. Symptoms could likely be a result of anemia. Given critical hemoglobin level, would likely benefit from transfusion. Discussed results with patient. She has not been having any melena, hematochezia, or vaginal bleeding. Her LMP was 4/05. Discussed risks and benefits of transfusion with patient. She is agreeable to admission and transfusion. Transfusion protocol initiated. BMP still pending. Plan to admit for symptomatic anemia.   5:06 PM: Discussed with Dr. Lendon Colonel. Will admit patient for transfusion and further evaluation.    Final Clinical Impressions(s) / ED Diagnoses   Final diagnoses:  Symptomatic anemia    New Prescriptions New Prescriptions   No medications on file     Maxwell Caul, PA-C 11/07/16 1829    Tilden Fossa, MD 11/08/16 0730

## 2016-11-07 NOTE — ED Notes (Addendum)
Pt reports feeling better after receiving iv fluids from EMS. Denies pain and denies n/v/d at this time.

## 2016-11-07 NOTE — H&P (Signed)
History and Physical    Kathryn Barrett ZOX:[id number] DOB: [birth year] DOA: 11/07/2016  Referring MD/NP/PA: Mardella Layman, PA  PCP: No PCP Per Patient   Patient coming from: home   Chief Complaint: lightheadedness   HPI: Kathryn Barrett is a 46 y.o. female with medical history significant for menorrhagia and hypertension who presented to ED with almost passing out today prior to this admission. Pt apparently felt dizzy and she laid down as she felt she was going to pass out. She reported she had similar episode once when she was started taking lisinopril. No chest pain, no shortness of breath or palpitations. No abdominal pain, nausea or vomiting. No fevers or chills.  ED Course: Pt hemodynamically stable in ED. Blood work showed hemoglobin 6.7. Pt has 2 units pRBC started transfusing in ED. She is admitted for symptomatic anemia.   Review of Systems:  Constitutional: Negative for fever, chills, diaphoresis, activity change, appetite change and fatigue.  HENT: Negative for ear pain, nosebleeds, congestion, facial swelling, rhinorrhea, neck pain, neck stiffness and ear discharge.   Eyes: Negative for pain, discharge, redness, itching and visual disturbance.  Respiratory: Negative for cough, choking, chest tightness, shortness of breath, wheezing and stridor.   Cardiovascular: Negative for chest pain, palpitations and leg swelling.  Gastrointestinal: Negative for abdominal distention.  Genitourinary: Negative for dysuria, urgency, frequency, hematuria, flank pain, decreased urine volume, difficulty urinating and dyspareunia.  Musculoskeletal: Negative for back pain, joint swelling, arthralgias and gait problem.  Neurological: per HPI Hematological: Negative for adenopathy. Does not bruise/bleed easily.  Psychiatric/Behavioral: Negative for hallucinations, behavioral problems, confusion, dysphoric mood, decreased concentration and agitation.   Past Medical History:  Diagnosis Date  .  Hypertension     History reviewed. No pertinent surgical history.  Social history:  reports that she has never smoked. She has never used smokeless tobacco. She reports that she does not drink alcohol or use drugs.  Ambulation: ambulates without assistance at baseline   No Known Allergies  Family History  Problem Relation Age of Onset  . Breast cancer Mother   . Diabetes Maternal Grandmother   . Diabetes Maternal Grandfather   . Diabetes Paternal Grandmother   . Diabetes Paternal Grandfather     Prior to Admission medications   Medication Sig Start Date End Date Taking? Authorizing Provider  ferrous sulfate (FERROUSUL) 325 (65 FE) MG tablet Take 1 tablet (325 mg total) by mouth 3 (three) times daily with meals. 02/25/15  Yes Deepak Advani, MD  lisinopril (PRINIVIL,ZESTRIL) 10 MG tablet TAKE 1 TABLET BY MOUTH DAILY. 10/15/16  Yes Jaclyn Shaggy, MD  diphenhydrAMINE (BENADRYL) 25 MG tablet Take 1 tablet (25 mg total) by mouth every 6 (six) hours as needed for itching (Rash). Patient not taking: Reported on 11/07/2016 05/07/16   Garlon Hatchet, PA-C  hydrocortisone cream 1 % Apply 1 application topically 2 (two) times daily. Do not apply to face Patient not taking: Reported on 11/07/2016 05/07/16   Garlon Hatchet, PA-C    Physical Exam: Vitals:   11/07/16 1220 11/07/16 1229 11/07/16 1445 11/07/16 1707  BP:  121/70 137/79 135/79  Pulse:  81 77 82  Resp:  15 16 18   Temp:  98.7 F (37.1 C)  98.3 F (36.8 C)  TempSrc:  Oral  Oral  SpO2: 98% 100% 100% 100%    Constitutional: NAD, calm, comfortable Vitals:   11/07/16 1220 11/07/16 1229 11/07/16 1445 11/07/16 1707  BP:  121/70 137/79 135/79  Pulse:  81 77 82  Resp:  15 16 18   Temp:  98.7 F (37.1 C)  98.3 F (36.8 C)  TempSrc:  Oral  Oral  SpO2: 98% 100% 100% 100%   Eyes: PERRL, lids and conjunctivae normal ENMT: Mucous membranes are moist. Posterior pharynx clear of any exudate or lesions.Normal dentition.  Neck: normal,  supple, no masses, no thyromegaly Respiratory: clear to auscultation bilaterally, no wheezing, no crackles. Normal respiratory effort. No accessory muscle use.  Cardiovascular: Regular rate and rhythm, no murmurs / rubs / gallops. No extremity edema. 2+ pedal pulses. No carotid bruits.  Abdomen: no tenderness, no masses palpated. No hepatosplenomegaly. Bowel sounds positive.  Musculoskeletal: no clubbing / cyanosis. No joint deformity upper and lower extremities. Good ROM, no contractures. Normal muscle tone.  Skin: no rashes, lesions, ulcers. No induration Neurologic: CN 2-12 grossly intact. Sensation intact, DTR normal. Strength 5/5 in all 4.  Psychiatric: Normal judgment and insight. Alert and oriented x 3. Normal mood.   Labs on Admission: I have personally reviewed following labs and imaging studies  CBC:  Recent Labs Lab 11/07/16 1450  WBC 11.9*  HGB 6.7*  HCT 24.3*  MCV 55.0*  PLT 572*   Basic Metabolic Panel:  Recent Labs Lab 11/07/16 1624  NA 139  K 4.1  CL 107  CO2 24  GLUCOSE 98  BUN 7  CREATININE 0.80  CALCIUM 9.2   GFR: CrCl cannot be calculated (Unknown ideal weight.). Liver Function Tests: No results for input(s): AST, ALT, ALKPHOS, BILITOT, PROT, ALBUMIN in the last 168 hours. No results for input(s): LIPASE, AMYLASE in the last 168 hours. No results for input(s): AMMONIA in the last 168 hours. Coagulation Profile: No results for input(s): INR, PROTIME in the last 168 hours. Cardiac Enzymes: No results for input(s): CKTOTAL, CKMB, CKMBINDEX, TROPONINI in the last 168 hours. BNP (last 3 results) No results for input(s): PROBNP in the last 8760 hours. HbA1C: No results for input(s): HGBA1C in the last 72 hours. CBG:  Recent Labs Lab 11/07/16 1428  GLUCAP 80   Lipid Profile: No results for input(s): CHOL, HDL, LDLCALC, TRIG, CHOLHDL, LDLDIRECT in the last 72 hours. Thyroid Function Tests: No results for input(s): TSH, T4TOTAL, FREET4, T3FREE,  THYROIDAB in the last 72 hours. Anemia Panel: No results for input(s): VITAMINB12, FOLATE, FERRITIN, TIBC, IRON, RETICCTPCT in the last 72 hours. Urine analysis: No results found for: COLORURINE, APPEARANCEUR, LABSPEC, PHURINE, GLUCOSEU, HGBUR, BILIRUBINUR, KETONESUR, PROTEINUR, UROBILINOGEN, NITRITE, LEUKOCYTESUR Sepsis Labs: @LABRCNTIP (procalcitonin:4,lacticidven:4) )No results found for this or any previous visit (from the past 240 hour(s)).   Radiological Exams on Admission: No results found.  EKG: Independently reviewed. Sinus rhythm.  Assessment/Plan  Active Problems:  Symptomatic Anemia / microcytic anemia  - Given 2 U pRBC transfusion - Check hgb in am - Continue ferrous sulfate supplementation    Essential hypertension - Continue lisinopril       DVT prophylaxis: SCD's bilaterally   Code Status: full code Family Communication: no family at the bedside  Disposition Plan: admission to medical floor  Consults called: none Admission status: observation, pt needs blood transfusion and repeat CBC In am, if hgb stable in am, she potentially can go home in am   Manson Passey MD [facility] Pager [phone]  If 7PM-7AM, please contact night-coverage [email or web address] Password [facility]  11/07/2016, 5:17 PM

## 2016-11-07 NOTE — ED Notes (Signed)
Bed: SA63 Expected date: 11/07/16 Expected time: 12:13 PM Means of arrival: Ambulance Comments: Syncope

## 2016-11-08 DIAGNOSIS — D649 Anemia, unspecified: Secondary | ICD-10-CM | POA: Diagnosis not present

## 2016-11-08 DIAGNOSIS — D500 Iron deficiency anemia secondary to blood loss (chronic): Secondary | ICD-10-CM

## 2016-11-08 LAB — BPAM RBC
Blood Product Expiration Date: 201805092359
Blood Product Expiration Date: 201805092359
ISSUE DATE / TIME: 201804151757
ISSUE DATE / TIME: 201804152115
UNIT TYPE AND RH: 7300
Unit Type and Rh: 7300

## 2016-11-08 LAB — TYPE AND SCREEN
ABO/RH(D): B POS
Antibody Screen: NEGATIVE
UNIT DIVISION: 0
Unit division: 0

## 2016-11-08 LAB — BASIC METABOLIC PANEL
Anion gap: 7 (ref 5–15)
BUN: 6 mg/dL (ref 6–20)
CALCIUM: 8.9 mg/dL (ref 8.9–10.3)
CO2: 24 mmol/L (ref 22–32)
Chloride: 109 mmol/L (ref 101–111)
Creatinine, Ser: 0.78 mg/dL (ref 0.44–1.00)
GFR calc Af Amer: >60 mL/min (ref >60)
GLUCOSE: 115 mg/dL — AB (ref 65–99)
Potassium: 3.7 mmol/L (ref 3.5–5.1)
SODIUM: 140 mmol/L (ref 135–145)

## 2016-11-08 LAB — PHOSPHORUS: Phosphorus: 3.9 mg/dL (ref 2.5–4.6)

## 2016-11-08 LAB — IRON AND TIBC
Iron: 14 ug/dL — ABNORMAL LOW (ref 28–170)
Saturation Ratios: 3 % — ABNORMAL LOW (ref 10.4–31.8)
TIBC: 472 ug/dL — ABNORMAL HIGH (ref 250–450)
UIBC: 458 ug/dL

## 2016-11-08 LAB — FERRITIN: FERRITIN: 3 ng/mL — AB (ref 11–307)

## 2016-11-08 LAB — MAGNESIUM: MAGNESIUM: 2 mg/dL (ref 1.7–2.4)

## 2016-11-08 LAB — FOLATE: Folate: 10 ng/mL (ref >5.9)

## 2016-11-08 LAB — VITAMIN B12: Vitamin B-12: 416 pg/mL (ref 180–914)

## 2016-11-08 LAB — GLUCOSE, CAPILLARY: Glucose-Capillary: 100 mg/dL — ABNORMAL HIGH (ref 65–99)

## 2016-11-08 MED ORDER — ASCORBIC ACID 500 MG PO TABS: 500.0000 mg | ORAL_TABLET | Freq: Two times a day (BID) | ORAL | 0 refills | Status: DC

## 2016-11-08 MED ORDER — FERUMOXYTOL INJECTION 510 MG/17 ML
510.0000 mg | Freq: Once | INTRAVENOUS | Status: AC
  Administered 2016-11-08: 510 mg via INTRAVENOUS
  Filled 2016-11-08: qty 17

## 2016-11-08 MED ORDER — VITAMIN C 500 MG PO TABS
500.0000 mg | ORAL_TABLET | Freq: Two times a day (BID) | ORAL | Status: DC
  Administered 2016-11-08: 500 mg via ORAL
  Filled 2016-11-08: qty 1

## 2016-11-08 NOTE — Discharge Summary (Signed)
Physician Discharge Summary  Kathryn Barrett FXJ:[id number] DOB: 09/19/1970 DOA: 11/07/2016  PCP: No PCP Per Patient  Admit date: 11/07/2016 Discharge date: 11/08/2016  Admitted From: Home Disposition:  Home  Recommendations for Outpatient Follow-up:  1. Follow up with PCP in 1-2 weeks 2. Please obtain CBC in one week    Discharge Condition: Stable CODE STATUS: FULL Diet recommendation:  Regular   Brief/Interim Summary: 46 year old female with a history of hypertension and menorrhagia presented with one-day history of lightheadedness and near syncope. The patient states that she has had a long history of menorrhagia and was told to start taking iron or a one year ago. The patient has not been faithful with her oral iron at home taking in only 4 times per week on the average. The patient denied any fevers, chills, nausea, vomiting, chest discomfort, shortness of breath. Upon presentation, the patient was noted to have hemoglobin of 6.7. BMP was unremarkable. EKG shows sinus rhythm with early repolarization. The patient was transfused 2 units PRBC with symptomatically improvement.  Discharge Diagnoses:  Symptomatic anemia/chronic blood loss anemia -Transfused 2 units PRBC--> clinically improved -iron saturation--3%; ferritin 3 -Feraheme x 1 transfused -start vitamin C to help with iron absorption -compliance with oral iron discussed  Thrombocytosis -Secondary to iron deficiency  Hypertension -Continue lisinopril -Controlled   Discharge Instructions  Discharge Instructions    Diet general    Complete by:  As directed    Increase activity slowly    Complete by:  As directed      Allergies as of 11/08/2016   No Known Allergies     Medication List    STOP taking these medications   diphenhydrAMINE 25 MG tablet Commonly known as:  BENADRYL   hydrocortisone cream 1 %     TAKE these medications   ascorbic acid 500 MG tablet Commonly known as:  VITAMIN C Take 1  tablet (500 mg total) by mouth 2 (two) times daily.   ferrous sulfate 325 (65 FE) MG tablet Commonly known as:  FERROUSUL Take 1 tablet (325 mg total) by mouth 3 (three) times daily with meals.   lisinopril 10 MG tablet Commonly known as:  PRINIVIL,ZESTRIL TAKE 1 TABLET BY MOUTH DAILY.       No Known Allergies  Consultations:  none   Procedures/Studies: No results found.      Discharge Exam: Vitals:   11/08/16 0548 11/08/16 1422  BP: 126/72 (!) 141/80  Pulse: 81 79  Resp: 18 20  Temp: 98.3 F (36.8 C) 98.7 F (37.1 C)   Vitals:   11/08/16 0045 11/08/16 0050 11/08/16 0548 11/08/16 1422  BP: 126/77 126/77 126/72 (!) 141/80  Pulse: 87 87 81 79  Resp: 18 18 18 20   Temp: 97.5 F (36.4 C) 97.5 F (36.4 C) 98.3 F (36.8 C) 98.7 F (37.1 C)  TempSrc: Oral Oral Oral Oral  SpO2:  100% 100% 100%  Height:   5\' 4"  (1.626 m)     General: Pt is alert, awake, not in acute distress Cardiovascular: RRR, S1/S2 +, no rubs, no gallops Respiratory: CTA bilaterally, no wheezing, no rhonchi Abdominal: Soft, NT, ND, bowel sounds + Extremities: no edema, no cyanosis   The results of significant diagnostics from this hospitalization (including imaging, microbiology, ancillary and laboratory) are listed below for reference.    Significant Diagnostic Studies: No results found.   Microbiology: No results found for this or any previous visit (from the past 240 hour(s)).   Labs: Basic Metabolic Panel:  Recent Labs Lab 11/07/16 1624 11/08/16 0441  NA 139 140  K 4.1 3.7  CL 107 109  CO2 24 24  GLUCOSE 98 115*  BUN 7 6  CREATININE 0.80 0.78  CALCIUM 9.2 8.9  MG  --  2.0  PHOS  --  3.9   Liver Function Tests: No results for input(s): AST, ALT, ALKPHOS, BILITOT, PROT, ALBUMIN in the last 168 hours. No results for input(s): LIPASE, AMYLASE in the last 168 hours. No results for input(s): AMMONIA in the last 168 hours. CBC:  Recent Labs Lab 11/07/16 1450  11/08/16 0441  WBC 11.9* 9.9  HGB 6.7* 9.1*  HCT 24.3* 29.6*  MCV 55.0* 59.6*  PLT 572* 430*   Cardiac Enzymes: No results for input(s): CKTOTAL, CKMB, CKMBINDEX, TROPONINI in the last 168 hours. BNP: Invalid input(s): POCBNP CBG:  Recent Labs Lab 11/07/16 1428 11/08/16 0729  GLUCAP 80 100*    Time coordinating discharge:  Greater than 30 minutes  Signed:  Bailie Christenbury, DO [facility] Pager: [phone] 11/08/2016, 2:34 PM

## 2016-11-08 NOTE — Progress Notes (Signed)
IVs removed. DC instructions reviewed with patient. Pt requests doctors note for work, MD paged.

## 2016-11-08 NOTE — Progress Notes (Signed)
IV removed from right ac and left ac in preparation for discharge.

## 2016-11-08 NOTE — Progress Notes (Signed)
Pt arrived to unit room 1508 accompanied by family members. VS taken, pt oriented to room and call bell with no complications. Gait steady, general weakness. Pt guide at the bedside. Initial assessment completed. Will continue to monitor.

## 2016-11-08 NOTE — Progress Notes (Signed)
Date: November 08, 2016 Discharge orders review for case management needs.  None found Moses Odoherty, BSN, RN3, CCM: [phone] 

## 2016-11-09 LAB — CBC
HCT: 29.6 % — ABNORMAL LOW (ref 36.0–46.0)
Hemoglobin: 9.1 g/dL — ABNORMAL LOW (ref 12.0–15.0)
MCH: 18.3 pg — AB (ref 26.0–34.0)
MCHC: 30.7 g/dL (ref 30.0–36.0)
MCV: 59.6 fL — AB (ref 78.0–100.0)
PLATELETS: 430 10*3/uL — AB (ref 150–400)
RBC: 4.97 MIL/uL (ref 3.87–5.11)
RDW: 27.2 % — AB (ref 11.5–15.5)
WBC: 9.9 10*3/uL (ref 4.0–10.5)

## 2016-11-09 LAB — HIV ANTIBODY (ROUTINE TESTING W REFLEX): HIV Screen 4th Generation wRfx: NONREACTIVE

## 2016-11-19 ENCOUNTER — Other Ambulatory Visit: Payer: Self-pay | Admitting: Family Medicine

## 2016-12-13 MED FILL — ?LISINOPRIL 10 MG TABLET: 10 | 30 days supply | Qty: 30 | Fill #0

## 2017-01-24 ENCOUNTER — Other Ambulatory Visit: Payer: Self-pay | Admitting: Family Medicine

## 2017-02-07 ENCOUNTER — Other Ambulatory Visit: Payer: Self-pay | Admitting: Family Medicine

## 2017-02-07 DIAGNOSIS — D508 Other iron deficiency anemias: Secondary | ICD-10-CM

## 2017-02-07 MED ORDER — FERROUS SULFATE 325 (65 FE) MG PO TABS: 325.0000 mg | ORAL_TABLET | Freq: Three times a day (TID) | ORAL | 3 refills | Status: DC

## 2017-05-29 ENCOUNTER — Other Ambulatory Visit: Payer: Self-pay | Admitting: Family Medicine

## 2017-05-29 DIAGNOSIS — D508 Other iron deficiency anemias: Secondary | ICD-10-CM

## 2017-06-04 ENCOUNTER — Emergency Department (HOSPITAL_BASED_OUTPATIENT_CLINIC_OR_DEPARTMENT_OTHER)
Admission: EM | Admit: 2017-06-04 | Discharge: 2017-06-04 | Disposition: A | Discharge status: Home / Self Care | Payer: BLUE CROSS/BLUE SHIELD | Attending: Emergency Medicine | Admitting: Emergency Medicine

## 2017-06-04 ENCOUNTER — Encounter (HOSPITAL_BASED_OUTPATIENT_CLINIC_OR_DEPARTMENT_OTHER): Payer: Self-pay | Admitting: Emergency Medicine

## 2017-06-04 ENCOUNTER — Other Ambulatory Visit: Payer: Self-pay

## 2017-06-04 DIAGNOSIS — R519 Headache, unspecified: Secondary | ICD-10-CM

## 2017-06-04 DIAGNOSIS — R51 Headache: Secondary | ICD-10-CM | POA: Insufficient documentation

## 2017-06-04 DIAGNOSIS — Z79899 Other long term (current) drug therapy: Secondary | ICD-10-CM | POA: Insufficient documentation

## 2017-06-04 DIAGNOSIS — I10 Essential (primary) hypertension: Secondary | ICD-10-CM | POA: Diagnosis not present

## 2017-06-04 MED ORDER — LISINOPRIL 10 MG PO TABS
10.0000 mg | ORAL_TABLET | Freq: Once | ORAL | Status: AC
  Administered 2017-06-04: 10 mg via ORAL
  Filled 2017-06-04: qty 1

## 2017-06-04 MED ORDER — IBUPROFEN 800 MG PO TABS
800.0000 mg | ORAL_TABLET | Freq: Once | ORAL | Status: AC
  Administered 2017-06-04: 800 mg via ORAL
  Filled 2017-06-04: qty 1

## 2017-06-04 MED ORDER — LISINOPRIL 10 MG PO TABS: 20.0000 mg | ORAL_TABLET | Freq: Every day | ORAL | 0 refills | Status: DC

## 2017-06-04 NOTE — ED Provider Notes (Signed)
[facility] EMERGENCY DEPARTMENT Provider Note   CSN: [id number] Arrival date & time: 06/04/17  1111     History   Chief Complaint Chief Complaint  Patient presents with  . Headache    HPI Kathryn Barrett is a 46 y.o. female.   Headache   This is a recurrent problem. The current episode started 1 to 2 hours ago. The problem occurs constantly. The problem has not changed since onset.The headache is associated with bright light. The pain is located in the temporal region. The quality of the pain is described as dull. The pain is moderate. The pain does not radiate. Pertinent negatives include no anorexia, no fever, no orthopnea and no syncope. She has tried nothing for the symptoms. The treatment provided no relief.    Past Medical History:  Diagnosis Date  . Hypertension     Patient Active Problem List   Diagnosis Date Noted  . Iron deficiency anemia due to chronic blood loss 11/08/2016  . Symptomatic anemia   . Anemia 11/07/2016  . Anemia due to chronic blood loss 05/05/2015  . Prediabetes 01/01/2014  . Essential hypertension, benign 01/01/2014  . Menorrhagia 01/01/2014  . Breast lump on left side at 6 o'clock position 07/31/2013    History reviewed. No pertinent surgical history.  OB History    Gravida Para Term Preterm AB Living   0             SAB TAB Ectopic Multiple Live Births                   Home Medications    Prior to Admission medications   Medication Sig Start Date End Date Taking? Authorizing Provider  ferrous sulfate (FERROUSUL) 325 (65 FE) MG tablet Take 1 tablet (325 mg total) by mouth 3 (three) times daily with meals. 02/07/17  Yes Jaclyn ShaggyAmao, Enobong, MD  lisinopril (PRINIVIL,ZESTRIL) 10 MG tablet Take 2 tablets (20 mg total) daily by mouth. 06/04/17   Laurell Coalson, Barbara CowerJason, MD  vitamin C (VITAMIN C) 500 MG tablet Take 1 tablet (500 mg total) by mouth 2 (two) times daily. 11/08/16   Catarina Hartshornat, David, MD    Family History Family History  Problem  Relation Age of Onset  . Breast cancer Mother   . Diabetes Maternal Grandmother   . Diabetes Maternal Grandfather   . Diabetes Paternal Grandmother   . Diabetes Paternal Grandfather     Social History Social History   Tobacco Use  . Smoking status: Never Smoker  . Smokeless tobacco: Never Used  Substance Use Topics  . Alcohol use: No    Alcohol/week: 0.0 oz  . Drug use: No     Allergies   Patient has no known allergies.   Review of Systems Review of Systems  Constitutional: Negative for fever.  Cardiovascular: Negative for orthopnea and syncope.  Gastrointestinal: Negative for anorexia.  Neurological: Positive for headaches.  All other systems reviewed and are negative.    Physical Exam Updated Vital Signs BP (!) 136/91 (BP Location: Right Arm)   Pulse 81   Temp 98.1 F (36.7 C) (Oral)   Resp 18   Ht 5\' 4"  (1.626 m)   Wt 113.4 kg (250 lb)   LMP 05/15/2017   SpO2 100%   BMI 42.91 kg/m   Physical Exam  Constitutional: She appears well-developed and well-nourished.  HENT:  Head: Normocephalic and atraumatic.  Eyes: EOM are normal. Pupils are equal, round, and reactive to light.  Neck: Normal range of  motion.  Cardiovascular: Normal rate and regular rhythm.  Pulmonary/Chest: No stridor. No respiratory distress.  Abdominal: She exhibits no distension.  Neurological: She is alert. She has normal strength. She is not disoriented. She displays normal reflexes. No cranial nerve deficit or sensory deficit. Coordination and gait normal. GCS eye subscore is 4. GCS verbal subscore is 5. GCS motor subscore is 6.  No altered mental status, able to give full seemingly accurate history.  Face is symmetric, EOM's intact, pupils equal and reactive, vision intact, tongue and uvula midline without deviation. Upper and Lower extremity motor 5/5, intact pain perception in distal extremities, 2+ reflexes in biceps, patella and achilles tendons. Able to perform finger to nose  normal with both hands. Walks without assistance or evident ataxia.    Nursing note and vitals reviewed.    ED Treatments / Results  Labs (all labs ordered are listed, but only abnormal results are displayed) Labs Reviewed - No data to display  EKG  EKG Interpretation None       Radiology No results found.  Procedures Procedures (including critical care time)  Medications Ordered in ED Medications  ibuprofen (ADVIL,MOTRIN) tablet 800 mg (800 mg Oral Given 06/04/17 1202)  lisinopril (PRINIVIL,ZESTRIL) tablet 10 mg (10 mg Oral Given 06/04/17 1202)     Initial Impression / Assessment and Plan / ED Course  I have reviewed the triage vital signs and the nursing notes.  Pertinent labs & imaging results that were available during my care of the patient were reviewed by me and considered in my medical decision making (see chart for details).     Suspect HA 2/2 slightly elevated BP as it started since she stopped her medications. Patient does not want headache cocktail or further workup, just to try treating her BP to see if it helps which I think is reasonable at this time as I have low suspicion for bleed/tumor/HTN crisis as cause for her ha at this time. .  Final Clinical Impressions(s) / ED Diagnoses   Final diagnoses:  Nonintractable headache, unspecified chronicity pattern, unspecified headache type    ED Discharge Orders        Ordered    lisinopril (PRINIVIL,ZESTRIL) 10 MG tablet  Daily     06/04/17 1319       Curran Lenderman, Barbara Cower, MD 06/04/17 1321

## 2017-06-04 NOTE — ED Triage Notes (Addendum)
Intermittent HA with dizziness x 4 days. Pain is in temporal area. Pt reports being out of BP medicine for 3 days.

## 2017-07-07 ENCOUNTER — Ambulatory Visit: Payer: BLUE CROSS/BLUE SHIELD | Attending: Internal Medicine | Admitting: Internal Medicine

## 2017-07-07 ENCOUNTER — Encounter: Payer: Self-pay | Admitting: Internal Medicine

## 2017-07-07 VITALS — BP 128/86 | HR 88 | Temp 98.9°F | Resp 16 | Ht 64.0 in | Wt 241.2 lb

## 2017-07-07 DIAGNOSIS — Z79899 Other long term (current) drug therapy: Secondary | ICD-10-CM | POA: Diagnosis not present

## 2017-07-07 DIAGNOSIS — I10 Essential (primary) hypertension: Secondary | ICD-10-CM | POA: Diagnosis present

## 2017-07-07 DIAGNOSIS — N920 Excessive and frequent menstruation with regular cycle: Secondary | ICD-10-CM | POA: Insufficient documentation

## 2017-07-07 DIAGNOSIS — D508 Other iron deficiency anemias: Secondary | ICD-10-CM

## 2017-07-07 DIAGNOSIS — Z6841 Body Mass Index (BMI) 40.0 and over, adult: Secondary | ICD-10-CM

## 2017-07-07 DIAGNOSIS — R7303 Prediabetes: Secondary | ICD-10-CM | POA: Diagnosis not present

## 2017-07-07 DIAGNOSIS — Z833 Family history of diabetes mellitus: Secondary | ICD-10-CM | POA: Insufficient documentation

## 2017-07-07 DIAGNOSIS — Z2821 Immunization not carried out because of patient refusal: Secondary | ICD-10-CM

## 2017-07-07 DIAGNOSIS — Z803 Family history of malignant neoplasm of breast: Secondary | ICD-10-CM | POA: Diagnosis not present

## 2017-07-07 MED ORDER — LISINOPRIL 20 MG PO TABS: 20.0000 mg | ORAL_TABLET | Freq: Every day | ORAL | 6 refills | Status: DC

## 2017-07-07 MED ORDER — FERROUS SULFATE 325 (65 FE) MG PO TABS: 325.0000 mg | ORAL_TABLET | Freq: Three times a day (TID) | ORAL | 3 refills | Status: DC

## 2017-07-07 MED FILL — LISINOPRIL 20 MG TAB: 20 | 30 days supply | Qty: 30 | Fill #0

## 2017-07-07 MED FILL — FERROUS SULFATE 325 MG TAB: 325 (65 FE) | 30 days supply | Qty: 90 | Fill #0

## 2017-07-07 NOTE — Progress Notes (Signed)
Patient ID: Kathryn Barrett, female    DOB: Apr 04, 1971  MRN: [id number]  CC: Establish Care   Subjective: Kathryn Barrett is a 46 y.o. female who presents for chronic ds management. Last seen 03/2016 by Dr. Venetia Night. Her concerns today include:  Pt with hx of HTN, obesity and anemia due to menorrhagia  1. Anemia:  -states she was told by [facility] 3 mths ago that she is no longer anemic -taking iron TID. Still gets menses lasting 7 days  2. 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  Obesity: losing wgh intentionally. -drinks veggie smoothies -goes to [facility] 2-3 x a wk to exercise  HM: due for pap, flu and Tdap vaccines. Prefers to reschedule pap  Patient Active Problem List   Diagnosis Date Noted  . Iron deficiency anemia due to chronic blood loss 11/08/2016  . Symptomatic anemia   . Anemia 11/07/2016  . Anemia due to chronic blood loss 05/05/2015  . Prediabetes 01/01/2014  . Essential hypertension, benign 01/01/2014  . Menorrhagia 01/01/2014  . Breast lump on left side at 6 o'clock position 07/31/2013     Current Outpatient Medications on File Prior to Visit  Medication Sig Dispense Refill  . ferrous sulfate (FERROUSUL) 325 (65 FE) MG tablet Take 1 tablet (325 mg total) by mouth 3 (three) times daily with meals. 90 tablet 3  . lisinopril (PRINIVIL,ZESTRIL) 10 MG tablet Take 2 tablets (20 mg total) daily by mouth. 60 tablet 0  . vitamin C (VITAMIN C) 500 MG tablet Take 1 tablet (500 mg total) by mouth 2 (two) times daily. 60 tablet 0   No current facility-administered medications on file prior to visit.     No Known Allergies  Social History   Socioeconomic History  . Marital status: Single    Spouse name: Not on file  . Number of children: Not on file  . Years of education: Not on file  . Highest education level: Not on file  Social Needs  . Financial resource  strain: Not on file  . Food insecurity - worry: Not on file  . Food insecurity - inability: Not on file  . Transportation needs - medical: Not on file  . Transportation needs - non-medical: Not on file  Occupational History  . Not on file  Tobacco Use  . Smoking status: Never Smoker  . Smokeless tobacco: Never Used  Substance and Sexual Activity  . Alcohol use: No    Alcohol/week: 0.0 oz  . Drug use: No  . Sexual activity: Yes    Partners: Male    Birth control/protection: None  Other Topics Concern  . Not on file  Social History Narrative  . Not on file    Family History  Problem Relation Age of Onset  . Breast cancer Mother   . Diabetes Maternal Grandmother   . Diabetes Maternal Grandfather   . Diabetes Paternal Grandmother   . Diabetes Paternal Grandfather     History reviewed. No pertinent surgical history.  ROS: Review of Systems Neg except as above  PHYSICAL EXAM: BP 128/86 (BP Location: Left Arm, Patient Position: Sitting, Cuff Size: Normal)   Pulse 88   Temp 98.9 F (37.2 C) (Oral)   Resp 16   Ht 5\' 4"  (1.626 m)   Wt 241 lb 3.2 oz (109.4 kg)   SpO2 100%   BMI 41.40 kg/m  Wt Readings from Last 3 Encounters:  07/07/17 241 lb 3.2 oz (109.4 kg)  06/04/17 250 lb (113.4 kg)  05/07/16 258 lb 9.6 oz (117.3 kg)   Physical Exam General appearance - alert, well appearing, over wgh  middle age AAF and in no distress Mental status - alert, oriented to person, place, and time, normal mood, behavior, speech, dress, motor activity, and thought processes Eyes -pink conjunctiva  Nose - normal and patent, no erythema, discharge or polyps Mouth - mucous membranes moist, pharynx normal without lesions. 2 molar RT lower jaw is broken off in gum Neck - supple, no significant adenopathy Chest - clear to auscultation, no wheezes, rales or rhonchi, symmetric air entry Heart - normal rate, regular rhythm, normal S1, S2, no murmurs, rubs, clicks or gallops Extremities -  peripheral pulses normal, no pedal edema, no clubbing or cyanosis    Chemistry      Component Value Date/Time   NA 140 11/08/2016 0441   K 3.7 11/08/2016 0441   CL 109 11/08/2016 0441   CO2 24 11/08/2016 0441   BUN 6 11/08/2016 0441   CREATININE 0.78 11/08/2016 0441   CREATININE 0.76 01/01/2014 1201      Component Value Date/Time   CALCIUM 8.9 11/08/2016 0441   ALKPHOS 83 01/01/2014 1201   AST 12 01/01/2014 1201   ALT 11 01/01/2014 1201   BILITOT 0.4 01/01/2014 1201      ASSESSMENT AND PLAN: 1. Essential hypertension At goal. Continue Lisinopril and salt restriction - Lipid panel - lisinopril (PRINIVIL,ZESTRIL) 20 MG tablet; Take 1 tablet (20 mg total) by mouth daily.  Dispense: 30 tablet; Refill: 6  2. Other iron deficiency anemia - CBC - ferrous sulfate (FERROUSUL) 325 (65 FE) MG tablet; Take 1 tablet (325 mg total) by mouth 3 (three) times daily with meals.  Dispense: 90 tablet; Refill: 3  3. Class 3 severe obesity due to excess calories without serious comorbidity with body mass index (BMI) of 40.0 to 44.9 in adult [facility](HCC) -Commended her on weight loss so far.  Encourage any form of aerobic exercise for a total of 150 minutes a week. -Discussed healthy eating habits.  Printed information given.  4. Influenza vaccination declined  5. Tetanus, diphtheria, and acellular pertussis (Tdap) vaccination declined  HM: pt requested PAP be scheduled for a later date. She did not want to have it done today.   Patient was given the opportunity to ask questions.  Patient verbalized understanding of the plan and was able to repeat key elements of the plan.   No orders of the defined types were placed in this encounter.    Requested Prescriptions    No prescriptions requested or ordered in this encounter    No Follow-up on file.  Jonah Blueeborah Whittany Parish, MD, FACP

## 2017-07-07 NOTE — Progress Notes (Signed)
Patient is here to establish care. Patient stated she would like medication refills for blood pressure and iron supplements.

## 2017-07-07 NOTE — Patient Instructions (Signed)

## 2017-07-08 LAB — CBC
HEMOGLOBIN: 12.4 g/dL (ref 11.1–15.9)
Hematocrit: 37.8 % (ref 34.0–46.6)
MCH: 26.9 pg (ref 26.6–33.0)
MCHC: 32.8 g/dL (ref 31.5–35.7)
MCV: 82 fL (ref 79–97)
PLATELETS: 225 10*3/uL (ref 150–379)
RBC: 4.61 x10E6/uL (ref 3.77–5.28)
RDW: 14.9 % (ref 12.3–15.4)
WBC: 5.9 10*3/uL (ref 3.4–10.8)

## 2017-07-08 LAB — LIPID PANEL
CHOLESTEROL TOTAL: 142 mg/dL (ref 100–199)
Chol/HDL Ratio: 2.5 ratio (ref 0.0–4.4)
HDL: 57 mg/dL (ref >39)
LDL CALC: 76 mg/dL (ref 0–99)
TRIGLYCERIDES: 44 mg/dL (ref 0–149)
VLDL Cholesterol Cal: 9 mg/dL (ref 5–40)

## 2017-07-11 ENCOUNTER — Encounter (HOSPITAL_COMMUNITY): Payer: Self-pay

## 2017-07-15 ENCOUNTER — Telehealth: Payer: Self-pay | Admitting: *Deleted

## 2017-07-15 NOTE — Telephone Encounter (Signed)
-----   Message from Marcine Matareborah B Johnson, MD sent at 07/08/2017  9:11 PM EST ----- Let pt know that she is no longer anemic. Can cut back to one iron tab daily.

## 2017-07-15 NOTE — Telephone Encounter (Signed)
Medical Assistant left message on patient's home and cell voicemail. Voicemail states to give a call back to [place] with [facility] at [phone]. Patient is aware of no longer being anemic and taking one tablet daily of iron supplement.

## 2017-07-29 ENCOUNTER — Other Ambulatory Visit: Payer: Self-pay | Admitting: Physician Assistant

## 2017-07-29 DIAGNOSIS — Z1231 Encounter for screening mammogram for malignant neoplasm of breast: Secondary | ICD-10-CM

## 2017-08-04 MED FILL — LISINOPRIL 20 MG TAB: 20 | 30 days supply | Qty: 30 | Fill #1

## 2017-08-18 ENCOUNTER — Ambulatory Visit
Admission: RE | Admit: 2017-08-18 | Discharge: 2017-08-18 | Disposition: A | Discharge status: Home / Self Care | Payer: BLUE CROSS/BLUE SHIELD | Source: Ambulatory Visit | Attending: Physician Assistant | Admitting: Physician Assistant

## 2017-08-18 DIAGNOSIS — Z1231 Encounter for screening mammogram for malignant neoplasm of breast: Secondary | ICD-10-CM

## 2017-09-02 ENCOUNTER — Encounter: Payer: Self-pay | Admitting: Internal Medicine

## 2017-09-02 ENCOUNTER — Ambulatory Visit: Payer: BLUE CROSS/BLUE SHIELD | Attending: Internal Medicine | Admitting: Internal Medicine

## 2017-09-02 VITALS — BP 149/83 | HR 71 | Temp 98.0°F | Resp 16 | Ht 64.0 in | Wt 248.8 lb

## 2017-09-02 DIAGNOSIS — D500 Iron deficiency anemia secondary to blood loss (chronic): Secondary | ICD-10-CM | POA: Insufficient documentation

## 2017-09-02 DIAGNOSIS — R7303 Prediabetes: Secondary | ICD-10-CM | POA: Insufficient documentation

## 2017-09-02 DIAGNOSIS — Z6841 Body Mass Index (BMI) 40.0 and over, adult: Secondary | ICD-10-CM | POA: Insufficient documentation

## 2017-09-02 DIAGNOSIS — I10 Essential (primary) hypertension: Secondary | ICD-10-CM | POA: Diagnosis not present

## 2017-09-02 DIAGNOSIS — D508 Other iron deficiency anemias: Secondary | ICD-10-CM | POA: Diagnosis not present

## 2017-09-02 DIAGNOSIS — R42 Dizziness and giddiness: Secondary | ICD-10-CM | POA: Diagnosis not present

## 2017-09-02 DIAGNOSIS — Z803 Family history of malignant neoplasm of breast: Secondary | ICD-10-CM | POA: Diagnosis not present

## 2017-09-02 DIAGNOSIS — N920 Excessive and frequent menstruation with regular cycle: Secondary | ICD-10-CM | POA: Diagnosis not present

## 2017-09-02 DIAGNOSIS — Z79899 Other long term (current) drug therapy: Secondary | ICD-10-CM | POA: Insufficient documentation

## 2017-09-02 DIAGNOSIS — Z833 Family history of diabetes mellitus: Secondary | ICD-10-CM | POA: Insufficient documentation

## 2017-09-02 MED ORDER — FERROUS SULFATE 325 (65 FE) MG PO TABS: 325.0000 mg | ORAL_TABLET | Freq: Two times a day (BID) | ORAL | 3 refills | Status: DC

## 2017-09-02 MED ORDER — MECLIZINE HCL 25 MG PO TABS: 25.0000 mg | ORAL_TABLET | Freq: Two times a day (BID) | ORAL | 0 refills | Status: DC | PRN

## 2017-09-02 MED FILL — ?MECLIZINE 25MG TAB: 25 | 15 days supply | Qty: 30 | Fill #0

## 2017-09-02 MED FILL — FERROUS SULFATE 325 MG TAB: 325 (65 FE) | 30 days supply | Qty: 60 | Fill #0

## 2017-09-02 NOTE — Progress Notes (Signed)
Patient ID: Kathryn Barrett, female    DOB: Nov 28, 1970  MRN: [id number]  CC: Gynecologic Exam   Subjective: Kathryn Barrett is a 47 y.o. female who presents for PAP. Her concerns today include:  Pt with hx of HTN, obesity and anemia due to menorrhagia   1.  Currently on cycle that started yesterday so wants to postpone PAP Menses last 7-8 days then spotting with clots for 3-5 days.  heavy bleeding and cramps 1st 2 days with a lot of clots. Has to change pads Q 30 mins. Twice in past she had a cycle that lasted for wks.  Seen by Hoyle Sauer at the time which was several yrs ago and placed on BCP for few mths which regulated her peroid -Looks like pelvic ultrasound was ordered in the past by Dr. Marice Potter but patient never had it done. -She has iron deficiency anemia.  Last CBC revealed hemoglobin in the low normal range.  She was taking iron 3 times a day at that point.  I advised decreasing to once a day.  She has been doing that for the past several weeks  2.  He is requesting a prescription for meclizine which he used intermittently in the past for vertigo.  She has a current bottle with her with 3 tablets left but it is outdated.  States she gets the vertigo every several months.  When episodes occur they last for about 30 minutes.  She denies any changes in hearing with these episodes.  No nausea or vomiting. -Blood pressure elevated but she has not taken lisinopril as yet for the day Patient Active Problem List   Diagnosis Date Noted  . Class 3 severe obesity due to excess calories without serious comorbidity with body mass index (BMI) of 40.0 to 44.9 in adult (HCC) 07/07/2017  . Iron deficiency anemia due to chronic blood loss 11/08/2016  . Prediabetes 01/01/2014  . Essential hypertension 01/01/2014  . Menorrhagia 01/01/2014  . Breast lump on left side at 6 o'clock position 07/31/2013     Current Outpatient Medications on File Prior to Visit  Medication Sig Dispense Refill  .  lisinopril (PRINIVIL,ZESTRIL) 20 MG tablet Take 1 tablet (20 mg total) by mouth daily. 30 tablet 6  . vitamin C (VITAMIN C) 500 MG tablet Take 1 tablet (500 mg total) by mouth 2 (two) times daily. 60 tablet 0   No current facility-administered medications on file prior to visit.     No Known Allergies  Social History   Socioeconomic History  . Marital status: Single    Spouse name: Not on file  . Number of children: Not on file  . Years of education: Not on file  . Highest education level: Not on file  Social Needs  . Financial resource strain: Not on file  . Food insecurity - worry: Not on file  . Food insecurity - inability: Not on file  . Transportation needs - medical: Not on file  . Transportation needs - non-medical: Not on file  Occupational History  . Not on file  Tobacco Use  . Smoking status: Never Smoker  . Smokeless tobacco: Never Used  Substance and Sexual Activity  . Alcohol use: No    Alcohol/week: 0.0 oz  . Drug use: No  . Sexual activity: Yes    Partners: Male    Birth control/protection: None  Other Topics Concern  . Not on file  Social History Narrative  . Not on file    Family  History  Problem Relation Age of Onset  . Breast cancer Mother        late 54's  . Diabetes Maternal Grandmother   . Diabetes Maternal Grandfather   . Diabetes Paternal Grandmother   . Diabetes Paternal Grandfather     No past surgical history on file.  ROS: Review of Systems Negative except as stated above PHYSICAL EXAM: BP (!) 149/83   Pulse 71   Temp 98 F (36.7 C) (Oral)   Resp 16   Ht 5\' 4"  (1.626 m)   Wt 248 lb 12.8 oz (112.9 kg)   LMP 09/01/2017 Comment: Pt started period yesterday  SpO2 100%   BMI 42.71 kg/m   Wt Readings from Last 3 Encounters:  09/02/17 248 lb 12.8 oz (112.9 kg)  07/07/17 241 lb 3.2 oz (109.4 kg)  06/04/17 250 lb (113.4 kg)    Physical Exam  General appearance - alert, well appearing, and in no distress Mental status -  alert, oriented to person, place, and time, normal mood, behavior, speech, dress, motor activity, and thought processes Neurological - nonfocal  ASSESSMENT AND PLAN: 1. Menorrhagia with regular cycle Requested that she keep a calendar of when cycles began and when the end She will follow-up in 3 weeks for Pap smear - US Pelvis Complete; Future - US Transvaginal Non-OB; Future  2. Other iron deficiency anemia -Given her reports of vertigo we will recheck hematocrit to make sure that she has not become more anemic since we last checked.  Change iron to twice a day - Hematocrit - ferrous sulfate (FERROUSUL) 325 (65 FE) MG tablet; Take 1 tablet (325 mg total) by mouth 2 (two) times daily with a meal.  Dispense: 60 tablet; Refill: 3  3. Dizziness - meclizine (ANTIVERT) 25 MG tablet; Take 1 tablet (25 mg total) by mouth 2 (two) times daily as needed for dizziness.  Dispense: 30 tablet; Refill: 0  4. Essential hypertension Patient to take medicine when she returns home  Patient was given the opportunity to ask questions.  Patient verbalized understanding of the plan and was able to repeat key elements of the plan.   Orders Placed This Encounter  Procedures  . US Pelvis Complete  . US Transvaginal Non-OB  . Hematocrit     Requested Prescriptions   Signed Prescriptions Disp Refills  . meclizine (ANTIVERT) 25 MG tablet 30 tablet 0    Sig: Take 1 tablet (25 mg total) by mouth 2 (two) times daily as needed for dizziness.  . ferrous sulfate (FERROUSUL) 325 (65 FE) MG tablet 60 tablet 3    Sig: Take 1 tablet (325 mg total) by mouth 2 (two) times daily with a meal.    Return in about 3 weeks (around 09/23/2017) for PAP.  Jonah Blue, MD, FACP

## 2017-09-02 NOTE — Patient Instructions (Signed)
Increase Iron to twice a day.  Take with sips of orange juice.

## 2017-09-03 LAB — HEMATOCRIT: Hematocrit: 37.5 % (ref 34.0–46.6)

## 2017-09-07 ENCOUNTER — Ambulatory Visit (HOSPITAL_COMMUNITY): Payer: BLUE CROSS/BLUE SHIELD

## 2017-09-07 MED FILL — LISINOPRIL 20 MG TAB: 20 | 30 days supply | Qty: 30 | Fill #2

## 2017-09-09 ENCOUNTER — Telehealth: Payer: Self-pay

## 2017-09-09 NOTE — Telephone Encounter (Signed)
Contacted pt to go over lab results pt is aware and doesn't have any questions or concerns 

## 2017-09-26 ENCOUNTER — Ambulatory Visit: Payer: BLUE CROSS/BLUE SHIELD | Admitting: Internal Medicine

## 2017-10-07 MED FILL — LISINOPRIL 20 MG TAB: 20 | 30 days supply | Qty: 30 | Fill #3

## 2017-10-28 MED FILL — FERROUS SULFATE 325 MG TAB: 325 (65 FE) | 30 days supply | Qty: 60 | Fill #1

## 2017-11-09 MED FILL — LISINOPRIL 20 MG TAB: 20 | 30 days supply | Qty: 30 | Fill #4

## 2017-11-30 ENCOUNTER — Ambulatory Visit (INDEPENDENT_AMBULATORY_CARE_PROVIDER_SITE_OTHER): Payer: BLUE CROSS/BLUE SHIELD | Admitting: Neurology

## 2017-11-30 ENCOUNTER — Encounter: Payer: Self-pay | Admitting: Neurology

## 2017-11-30 DIAGNOSIS — G35 Multiple sclerosis: Secondary | ICD-10-CM

## 2017-11-30 NOTE — Patient Instructions (Signed)
I had a long discussion with the patient   regarding the intermittent  leg and  arm numbness and discussed differential diagnosis, plan for evaluation and answered questions. I recommend checking   MRI scan of the brain , cervical and thoraxic spine.Provider, please review the patient's drug allergy history. Some issues need clarification. 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 No specific treatment is indicated at the present time and this numbness returns and is disabling enough.   She will return for follow-up in 2 months or call earlier if necessary.

## 2017-11-30 NOTE — Progress Notes (Signed)
[facility] [address] [place]. [place] [address] [phone]       OFFICE CONSULT NOTE  Ms. Kathryn Barrett Date of Birth:  1970/09/29 Medical Record Number:  [id number]   Referring MD: Dr Oleta Mouse  Reason for Referral:  Leg numbness HPI: Initial visit 03/09/2016 60 year young [place] American lady is seen today for initial  office consultation. She is accompanied today by her mother. She states he developed sudden onset of left foot numbness on 02/29/16. Patient started in the bottom of the foot and over 1 minute spread to involve the left leg to below the knee. This lasted about an hour and a half and gradually went away. She was able to walk but The foot was numb and had to be careful. She denied any fall, back pain, radicular pain or injury. She had somewhat similar but milder episode 3 days ago when only the bottom of the foot felt numb this lasted only a few minutes and resolved. She is unable to identify specific triggers. She denies any habit of having chills, sweating cross legged. She has had no previous back problems of degenerative spine disease. She is pretty healthy except mild anemia and hypertension which appears to be well controlled. She denies any headaches, loss of vision, blurred vision, double vision, vertigo, gait or balance problems. She has no history of strokes, TIA, seizures, significant head injury or loss of consciousness. She has had no recent weight or appetite changes. Update 11/30/2017 She returns for follow-up after initial visit nearly 2 years ago.:  Patient was advised to undergo brain imaging and EMG nerve conduction study at that visit but chose not to do it since she did not have insurance.  She states her numbness actually improved and went away for about 6 months.  For the last 4 to 5 months however she is now complaining of intermittent numbness in the lateral aspect of the right foot as well as the right and forearm.  This is brought on without  any specific triggers and can last from minutes to hours and occurs on a variable basis.  She does not describe this as being annoying or a hard to tolerate.  On inquiry she also admits to history of intermittent vertigo and dizziness which has been occurring off and on for a month.  This is not usually positional but that she get worse in certain head positions.  She has been prescribed meclizine by primary physician but seems to be helping.  She denies any diplopia, focal extremity weakness gait or balance problems.  She denies excessive fatigue or tiredness.  She denies any bladder urgency incontinence.  She complains of mild headaches that she has had off-and-on.  She describes as frontal or involving the vertex moderate in intensity 7/10 and pressure-like in quality.  There are no specific triggers for the headaches.  Tylenol or Motrin seems to get rid of the headaches.  She denies prior history of migraines.  She denies any visual symptoms accompanying her headaches.  She returns now since she has health insurance and would like more detailed evaluation of her condition. ROS:   14 system review of systems is positive for  numbness, tingling, l headache, dizziness and all other systems negative  PMH:  Past Medical History:  Diagnosis Date  . Hypertension     Social History:  Social History   Socioeconomic History  . Marital status: Single    Spouse name: Not on file  . Number of  children: Not on file  . Years of education: Not on file  . Highest education level: Not on file  Occupational History  . Not on file  Social Needs  . Financial resource strain: Not on file  . Food insecurity:    Worry: Not on file    Inability: Not on file  . Transportation needs:    Medical: Not on file    Non-medical: Not on file  Tobacco Use  . Smoking status: Never Smoker  . Smokeless tobacco: Never Used  Substance and Sexual Activity  . Alcohol use: No    Alcohol/week: 0.0 oz  . Drug use: No  .  Sexual activity: Yes    Partners: Male    Birth control/protection: None  Lifestyle  . Physical activity:    Days per week: Not on file    Minutes per session: Not on file  . Stress: Not on file  Relationships  . Social connections:    Talks on phone: Not on file    Gets together: Not on file    Attends religious service: Not on file    Active member of club or organization: Not on file    Attends meetings of clubs or organizations: Not on file    Relationship status: Not on file  . Intimate partner violence:    Fear of current or ex partner: Not on file    Emotionally abused: Not on file    Physically abused: Not on file    Forced sexual activity: Not on file  Other Topics Concern  . Not on file  Social History Narrative  . Not on file    Medications:   Current Outpatient Medications on File Prior to Visit  Medication Sig Dispense Refill  . ferrous sulfate (FERROUSUL) 325 (65 FE) MG tablet Take 1 tablet (325 mg total) by mouth 2 (two) times daily with a meal. 60 tablet 3  . lisinopril (PRINIVIL,ZESTRIL) 20 MG tablet Take 1 tablet (20 mg total) by mouth daily. 30 tablet 6  . meclizine (ANTIVERT) 25 MG tablet Take 1 tablet (25 mg total) by mouth 2 (two) times daily as needed for dizziness. 30 tablet 0  . vitamin C (VITAMIN C) 500 MG tablet Take 1 tablet (500 mg total) by mouth 2 (two) times daily. 60 tablet 0   No current facility-administered medications on file prior to visit.     Allergies:  No Known Allergies  Physical Exam General: Obese young African-American lady, seated, in no evident distress Head: head normocephalic and atraumatic.   Neck: supple with no carotid or supraclavicular bruits Cardiovascular: regular rate and rhythm, no murmurs Musculoskeletal: no deformity Skin:  no rash/petichiae Vascular:  Normal pulses all extremities  Neurologic Exam Mental Status: Awake and fully alert. Oriented to place and time. Recent and remote memory intact. Attention  span, concentration and fund of knowledge appropriate. Mood and affect appropriate.  Cranial Nerves: Fundoscopic exam reveals sharp disc margins. Pupils equal, briskly reactive to light. Extraocular movements full without nystagmus. Visual fields full to confrontation. Hearing intact. Facial sensation intact. Face, tongue, palate moves normally and symmetrically.  Motor: Normal bulk and tone. Normal strength in all tested extremity muscles. Straight leg raising test is negative Sensory.: intact to touch , pinprick , position and vibratory sensation. Tinel's sign is negative over both fibular condyle Coordination: Rapid alternating movements normal in all extremities. Finger-to-nose and heel-to-shin performed accurately bilaterally.  Hallpike maneuver not done.  Head shaking no nystagmus.  Fukuda test  positive with patient moving off base. Gait and Station: Arises from chair without difficulty. Stance is normal. Gait demonstrates normal stride length and balance . Able to heel, toe and tandem walk without difficulty.  Reflexes: 1+ and symmetric. Toes downgoing.       ASSESSMENT: 47 year old obese young African-American lady with intermittent left foot and leg numbness  And vertigo of unclear etiology. Possibilities include demyelinating disease versus autoimmune, inflammatory conditions or cerebrovascular disease.  She also has family history of intracranial aneurysms  PLAN: I had a long discussion with the patient   regarding the intermittent  leg and  arm numbness and discussed differential diagnosis, plan for evaluation and answered questions. I recommend checking   MRI scan of the brain , cervical and thoraxic spine.Provider, please review the patient's drug allergy history. Some issues need clarification. Brain MRA fro aneurysm given f/o aneyryms.Check ANA, ESR, RPR, TSH, HIV and B12 levels. No specific treatment is indicated at the present time and this numbness returns and is disabling enough.    She will return for follow-up in 2 months or call earlier if necessary. Greater than 50% time during this 25 minute  visit was spent on counseling and coordination of care about her paresthesias and discussion of differential diagnosis and answering questions. She will return for follow-up in 2 months or call earlier if necessary. Antony Contras, MD  [facility] [address] [place], [place] [address]  Phone [phone] Fax [phone] Note: This document was prepared with digital dictation and possible smart phrase technology. Any transcriptional errors that result from this process are unintentional.

## 2017-12-01 ENCOUNTER — Other Ambulatory Visit: Payer: Self-pay

## 2017-12-01 ENCOUNTER — Telehealth: Payer: Self-pay | Admitting: Neurology

## 2017-12-01 LAB — RPR: RPR Ser Ql: NONREACTIVE

## 2017-12-01 LAB — TSH: TSH: 1.69 u[IU]/mL (ref 0.450–4.500)

## 2017-12-01 LAB — SEDIMENTATION RATE: Sed Rate: 26 mm/hr (ref 0–32)

## 2017-12-01 LAB — HIV ANTIBODY (ROUTINE TESTING W REFLEX): HIV Screen 4th Generation wRfx: NONREACTIVE

## 2017-12-01 LAB — ANA: Anti Nuclear Antibody(ANA): NEGATIVE

## 2017-12-01 LAB — VITAMIN B12: Vitamin B-12: 482 pg/mL (ref 232–1245)

## 2017-12-01 MED ORDER — ALPRAZOLAM 0.5 MG PO TABS: 0.5000 mg | ORAL_TABLET | Freq: Two times a day (BID) | ORAL | 0 refills | Status: DC | PRN

## 2017-12-01 NOTE — Telephone Encounter (Signed)
[employer] Auth: [id number] (exp. 11/30/17 to 12/29/17. Patient is scheduled for Tuesday 12/13/17 on the [facility] mobile unit to have her exams done..   She informed me that she might be claustrophc and she would like something order just in case.

## 2017-12-01 NOTE — Telephone Encounter (Signed)
Okay to order Xanax 0.25 mg x 1 for MRI and may repeat once if needed

## 2017-12-01 NOTE — Telephone Encounter (Signed)
Rn call patient that Xanax rx is done and sign by Dr.Sethi. Rn explain she will need a driver to and from the testing site because the medication will cause drowsiness. Pt stated she did not have driver at the moment. Rn recommend she call back next week before her test on 12/13/2017. Pt verbalized understanding of having a driver to,and from the testing site. Rn will hold on to xanax until pt confirms she has a driver.

## 2017-12-05 ENCOUNTER — Telehealth: Payer: Self-pay

## 2017-12-05 NOTE — Telephone Encounter (Signed)
Notes recorded by Marval Regal, RN on 12/05/2017 at 5:58 PM EDT Rn call patient that all her lab work for thyroid, syphilis, hiv, lupus, and esr,and b12 were normal.Pt verbalized understanding.

## 2017-12-05 NOTE — Telephone Encounter (Signed)
-----  Message from Garvin Fila, MD sent at 12/01/2017  4:01 PM EDT ----- Mitchell Heir inform the patient that all lab work tested for thyroid hormone, syphilis, HIV, lupus, ESR and B12 were normal

## 2017-12-05 NOTE — Telephone Encounter (Addendum)
Rn call patient and she has a driver to the  MRI testing site for her 4 scans. Rn recommend she take it an hour before her scan.Also take a second one if needed. RN stated the xanax will be fax tomorrow and be ready for pick up. Pt verbalized understanding.

## 2017-12-06 ENCOUNTER — Telehealth: Payer: Self-pay | Admitting: Neurology

## 2017-12-06 NOTE — Telephone Encounter (Signed)
Rn call patient that her pharmacy call to state they did not fill xanax rx. Pt states [facility]. Rn advised pt the xanax will be at the front desk for her to pick up.Pt verbalized understanding.

## 2017-12-06 NOTE — Telephone Encounter (Signed)
Amanda/[facility] [phone] called to advise they do not fill  ALPRAZolam (XANAX) 0.5 MG tablet. Please call pt to discuss where medication should be sent. Thank you

## 2017-12-08 MED FILL — LISINOPRIL 20 MG TAB: 20 | 30 days supply | Qty: 30 | Fill #5

## 2017-12-13 ENCOUNTER — Other Ambulatory Visit: Payer: Self-pay | Admitting: Neurology

## 2017-12-13 ENCOUNTER — Ambulatory Visit: Payer: BLUE CROSS/BLUE SHIELD

## 2017-12-13 DIAGNOSIS — G35 Multiple sclerosis: Secondary | ICD-10-CM

## 2017-12-27 ENCOUNTER — Telehealth: Payer: Self-pay

## 2017-12-27 NOTE — Telephone Encounter (Signed)
Kathryn Riley, MD         I called the patient and gave her results of the MRI scan of the cervical spine showing no significant abnormalities and MRI scan of the brain showing only tiny nonspecific white matter hyperintensities of unclear significance. No further action is needed at the present time but I advised the patient to call and make a follow-up appointment to see me to discuss further plan of care. She voiced understanding.

## 2017-12-27 NOTE — Telephone Encounter (Signed)
-----   Message from Micki Riley, MD sent at 12/27/2017  1:26 PM EDT ----- I called the patient and gave her results of the MRI scan of the cervical spine showing no significant abnormalities and MRI scan of the brain showing only tiny nonspecific white matter hyperintensities of unclear significance.  No further action is needed at the present time but I advised the patient to call and make a follow-up appointment to see me to discuss further plan of care.  She voiced understanding.

## 2017-12-29 ENCOUNTER — Telehealth: Payer: Self-pay

## 2017-12-29 NOTE — Telephone Encounter (Signed)
Notes recorded by Hildred Alamin, RN on 12/29/2017 at 9:58 AM EDT Rn call patient about MRi brain, MR head, MR thoracic and MRi spine.Kindly inform the patient that MRA of the brain showed no major blockages of the blood vessels in the brain. There was mild narrowing of 1 of the blood vessels towards the back of the head but no need to worry about any surgery or stenting at this time. MRI of the back showed no significant compression or any worrisome finding. Pt verbalized understanding

## 2017-12-29 NOTE — Telephone Encounter (Signed)
-----   Message from Micki Riley, MD sent at 12/27/2017  4:15 PM EDT ----- Joneen Roach inform the patient that MRA of the brain showed no major blockages of the blood vessels in the brain.  There was mild narrowing of 1 of the blood vessels towards the back of the head but no need to worry about any surgery or stenting at this time.  MRI of the back showed no significant compression or any worrisome finding.

## 2018-01-05 MED FILL — LISINOPRIL 20 MG TAB: 20 | 30 days supply | Qty: 30 | Fill #6

## 2018-02-06 ENCOUNTER — Telehealth: Payer: Self-pay | Admitting: Neurology

## 2018-02-06 ENCOUNTER — Ambulatory Visit (INDEPENDENT_AMBULATORY_CARE_PROVIDER_SITE_OTHER): Payer: BLUE CROSS/BLUE SHIELD | Admitting: Neurology

## 2018-02-06 ENCOUNTER — Encounter: Payer: Self-pay | Admitting: Neurology

## 2018-02-06 VITALS — BP 138/92 | HR 90 | Ht 64.0 in | Wt 247.0 lb

## 2018-02-06 DIAGNOSIS — R200 Anesthesia of skin: Secondary | ICD-10-CM

## 2018-02-06 DIAGNOSIS — Z6841 Body Mass Index (BMI) 40.0 and over, adult: Secondary | ICD-10-CM | POA: Diagnosis not present

## 2018-02-06 DIAGNOSIS — G35 Multiple sclerosis: Secondary | ICD-10-CM

## 2018-02-06 NOTE — Patient Instructions (Signed)
I had a long discussion with the patient regarding her symptoms of numbness which appeared to have resolved and discussed imaging results from a brain and spinal imaging and discussed differential diagnosis and lab work results. I recommend repeating MRI scan the brain with contrast to look for any enhancing lesions. We also discussed briefly need for diagnostic spinal tap to confirm possible multiple sclerosis. The patient appeared reluctant at the present time. Advise her to call me if she has any new neurological symptoms suggestive of multiple sclerosis. She'll return for follow-up in 6 months and will repeat brain MRI at that visit or call earlier if necessary.

## 2018-02-06 NOTE — Progress Notes (Signed)
[facility] [address] [place]. [place] [address] [phone]       OFFICE CONSULT NOTE  Kathryn Barrett Date of Birth:  August 29, 1970 Medical Record Number:  [id number]   Referring MD: Dr Oleta Mouse  Reason for Referral:  Leg numbness HPI: Initial visit 03/09/2016 44 year young [place] American lady is seen today for initial  office consultation. She is accompanied today by her mother. She states he developed sudden onset of left foot numbness on 02/29/16. Patient started in the bottom of the foot and over 1 minute spread to involve the left leg to below the knee. This lasted about an hour and a half and gradually went away. She was able to walk but The foot was numb and had to be careful. She denied any fall, back pain, radicular pain or injury. She had somewhat similar but milder episode 3 days ago when only the bottom of the foot felt numb this lasted only a few minutes and resolved. She is unable to identify specific triggers. She denies any habit of having chills, sweating cross legged. She has had no previous back problems of degenerative spine disease. She is pretty healthy except mild anemia and hypertension which appears to be well controlled. She denies any headaches, loss of vision, blurred vision, double vision, vertigo, gait or balance problems. She has no history of strokes, TIA, seizures, significant head injury or loss of consciousness. She has had no recent weight or appetite changes. Update 11/30/2017 She returns for follow-up after initial visit nearly 2 years ago.:  Patient was advised to undergo brain imaging and EMG nerve conduction study at that visit but chose not to do it since she did not have insurance.  She states her numbness actually improved and went away for about 6 months.  For the last 4 to 5 months however she is now complaining of intermittent numbness in the lateral aspect of the right foot as well as the right and forearm.  This is brought on without  any specific triggers and can last from minutes to hours and occurs on a variable basis.  She does not describe this as being annoying or a hard to tolerate.  On inquiry she also admits to history of intermittent vertigo and dizziness which has been occurring off and on for a month.  This is not usually positional but that she get worse in certain head positions.  She has been prescribed meclizine by primary physician but seems to be helping.  She denies any diplopia, focal extremity weakness gait or balance problems.  She denies excessive fatigue or tiredness.  She denies any bladder urgency incontinence.  She complains of mild headaches that she has had off-and-on.  She describes as frontal or involving the vertex moderate in intensity 7/10 and pressure-like in quality.  There are no specific triggers for the headaches.  Tylenol or Motrin seems to get rid of the headaches.  She denies prior history of migraines.  She denies any visual symptoms accompanying her headaches.  She returns now since she has health insurance and would like more detailed evaluation of her condition. Update 02/06/2018 : She returns for follow-up after last visit 2 months ago. She states 47 she's had no further numbness episodes. She complains of occasional minor occipital headache which is short lasting. She had MRI scan of the brain; thoracic spine done on 12/15/17 which are personally reviewed. MRI scan of the brain shows nonspecific periventricular white matter hyperintensities with a wide differential. MRI  scan as lichen thoracic spine were unremarkable. MRA of the brain shows no aneurysm there is a filling defect in the mid basilar artery which is likely an artifact and is present less than 50% stenosis. Lab works for B12, TSH, RPR, HIV, ANA and ESR were all normal. The patient denies any symptoms suggestive of multiple sclerosis in the form of blurred vision loss of vision, vertigo, diplopia, fatigue or bladder urgency. ROS:   14  system review of systems is positive for  numbness, tingling, l headache, dizziness and all other systems negative  PMH:  Past Medical History:  Diagnosis Date  . Hypertension     Social History:  Social History   Socioeconomic History  . Marital status: Single    Spouse name: Not on file  . Number of children: Not on file  . Years of education: Not on file  . Highest education level: Not on file  Occupational History  . Not on file  Social Needs  . Financial resource strain: Not on file  . Food insecurity:    Worry: Not on file    Inability: Not on file  . Transportation needs:    Medical: Not on file    Non-medical: Not on file  Tobacco Use  . Smoking status: Never Smoker  . Smokeless tobacco: Never Used  Substance and Sexual Activity  . Alcohol use: No    Alcohol/week: 0.0 oz  . Drug use: No  . Sexual activity: Yes    Partners: Male    Birth control/protection: None  Lifestyle  . Physical activity:    Days per week: Not on file    Minutes per session: Not on file  . Stress: Not on file  Relationships  . Social connections:    Talks on phone: Not on file    Gets together: Not on file    Attends religious service: Not on file    Active member of club or organization: Not on file    Attends meetings of clubs or organizations: Not on file    Relationship status: Not on file  . Intimate partner violence:    Fear of current or ex partner: Not on file    Emotionally abused: Not on file    Physically abused: Not on file    Forced sexual activity: Not on file  Other Topics Concern  . Not on file  Social History Narrative  . Not on file    Medications:   Current Outpatient Medications on File Prior to Visit  Medication Sig Dispense Refill  . ALPRAZolam (XANAX) 0.5 MG tablet Take 1 tablet (0.5 mg total) by mouth 2 (two) times daily as needed for anxiety. 2 tablet 0  . ferrous sulfate (FERROUSUL) 325 (65 FE) MG tablet Take 1 tablet (325 mg total) by mouth 2  (two) times daily with a meal. 60 tablet 3  . lisinopril (PRINIVIL,ZESTRIL) 20 MG tablet Take 1 tablet (20 mg total) by mouth daily. 30 tablet 6  . meclizine (ANTIVERT) 25 MG tablet Take 1 tablet (25 mg total) by mouth 2 (two) times daily as needed for dizziness. 30 tablet 0  . phentermine (ADIPEX-P) 37.5 MG tablet TK 1 T PO QD  0  . vitamin C (VITAMIN C) 500 MG tablet Take 1 tablet (500 mg total) by mouth 2 (two) times daily. 60 tablet 0   No current facility-administered medications on file prior to visit.     Allergies:  No Known Allergies  Physical Exam General:  Obese young African-American lady, seated, in no evident distress Head: head normocephalic and atraumatic.   Neck: supple with no carotid or supraclavicular bruits Cardiovascular: regular rate and rhythm, no murmurs Musculoskeletal: no deformity Skin:  no rash/petichiae Vascular:  Normal pulses all extremities  Neurologic Exam Mental Status: Awake and fully alert. Oriented to place and time. Recent and remote memory intact. Attention span, concentration and fund of knowledge appropriate. Mood and affect appropriate.  Cranial Nerves: Fundoscopic exam reveals sharp disc margins. Pupils equal, briskly reactive to light. Extraocular movements full without nystagmus. Visual fields full to confrontation. Hearing intact. Facial sensation intact. Face, tongue, palate moves normally and symmetrically.  Motor: Normal bulk and tone. Normal strength in all tested extremity muscles. Straight leg raising test is negative Sensory.: intact to touch , pinprick , position and vibratory sensation. Tinel's sign is negative over both fibular condyle Coordination: Rapid alternating movements normal in all extremities. Finger-to-nose and heel-to-shin performed accurately bilaterally.  Hallpike maneuver not done.  Head shaking no nystagmus.  Fukuda test positive with patient moving off base. Gait and Station: Arises from chair without difficulty.  Stance is normal. Gait demonstrates normal stride length and balance . Able to heel, toe and tandem walk without difficulty.  Reflexes: 1+ and symmetric. Toes downgoing.       ASSESSMENT: 47 year old obese young African-American lady with intermittent left foot and leg numbness  And vertigo of unclear etiology. Brain MRI scan shows nonspecific white matter hyperintensities but she does not meet clinical criteria for demyelinating disease at the present time. PLAN: I had a long discussion with the patient regarding her symptoms of numbness which appeared to have resolved and discussed imaging results from a brain and spinal imaging and discussed differential diagnosis and lab work results. I recommend repeating MRI scan the brain with contrast to look for any enhancing lesions. We also discussed briefly need for diagnostic spinal tap to confirm possible multiple sclerosis. The patient appeared reluctant at the present time. Advise her to call me if she has any new neurological symptoms suggestive of multiple sclerosis. She'll return for follow-up in 6 months and will repeat brain MRI at that visit or call earlier if necessary. Greater than 50% time during this 25 minute  visit was spent on counseling and coordination of care about her paresthesias and discussion of differential diagnosis and answering questions.  Antony Contras, MD  [facility] [address] [place], [place] [address]  Phone [phone] Fax [phone] Note: This document was prepared with digital dictation and possible smart phrase technology. Any transcriptional errors that result from this process are unintentional.

## 2018-02-06 NOTE — Telephone Encounter (Signed)
I just want to double check since the patient had the MR Brain wo contrast in May you want her to have just a MRI Brain with contrast and not MRI Brain w/wo contrast? Because they don't usually do the MRI's with just contrast.

## 2018-02-06 NOTE — Telephone Encounter (Signed)
Agree I want MRI with contrast only

## 2018-02-07 ENCOUNTER — Other Ambulatory Visit: Payer: Self-pay | Admitting: Internal Medicine

## 2018-02-07 DIAGNOSIS — I10 Essential (primary) hypertension: Secondary | ICD-10-CM

## 2018-02-07 MED FILL — LISINOPRIL 20 MG TAB: 20 | 30 days supply | Qty: 30 | Fill #0

## 2018-02-07 NOTE — Telephone Encounter (Signed)
Noted  

## 2018-02-07 NOTE — Telephone Encounter (Signed)
Patient is scheduled here at [facility] for 02/22/18 because GI will not do the MRI Brain with contrast. Patient also informed me she is some what claustrophobic and will like something to help her.   [employer] Auth: [id number] (exp. 02/07/18 to 03/08/18).

## 2018-02-10 MED ORDER — ALPRAZOLAM 0.5 MG PO TABS: 0.5000 mg | ORAL_TABLET | Freq: Two times a day (BID) | ORAL | 0 refills | Status: DC | PRN

## 2018-02-10 NOTE — Telephone Encounter (Signed)
Left vm for patient that xanax 2 pills was fax to her pharmacy for her MRI test this month.

## 2018-02-10 NOTE — Addendum Note (Signed)
Addended by: Hildred Alamin on: 02/10/2018 12:43 PM   Modules accepted: Orders

## 2018-02-10 NOTE — Telephone Encounter (Signed)
Xanax for 2 pills done for testing. Fax to pharmacy listed.

## 2018-02-13 ENCOUNTER — Telehealth: Payer: Self-pay | Admitting: Neurology

## 2018-02-13 NOTE — Telephone Encounter (Signed)
Rn call patient that her listed pharmacy does not refill xanax. Pt stated she will come by and pick up med for scans this week. Rx at front desk.

## 2018-02-13 NOTE — Telephone Encounter (Signed)
[facility] - [place], [place] - [address]  [phone]  has called to inform that they do not fill ALPRAZolam (XANAX) 0.5 MG tablet and that the prescription needs to be sent to another pharmacy.  No call back has been requested

## 2018-02-22 ENCOUNTER — Ambulatory Visit: Payer: BLUE CROSS/BLUE SHIELD

## 2018-02-22 DIAGNOSIS — G35 Multiple sclerosis: Secondary | ICD-10-CM

## 2018-02-22 MED ORDER — GADOPENTETATE DIMEGLUMINE 469.01 MG/ML IV SOLN
20.0000 mL | Freq: Once | INTRAVENOUS | Status: AC | PRN
  Administered 2018-02-22: 20 mL via INTRAVENOUS

## 2018-03-09 ENCOUNTER — Telehealth: Payer: Self-pay

## 2018-03-09 NOTE — Telephone Encounter (Signed)
Notes recorded by Hildred Alamin, RN on 03/09/2018 at 5:15 PM EDT RN call patient that the post contrast MRI scan of brain showed no enhancing lesions. NO new or worrisome findings. Pt verbalized understanding. ------

## 2018-03-09 NOTE — Telephone Encounter (Signed)
-----   Message from Micki Riley, MD sent at 03/09/2018  8:59 AM EDT ----- Kathryn Barrett inform the patient that postcontrast MRI scan of the brain showed no enhancing lesions. No new or worrisome finding

## 2018-03-13 ENCOUNTER — Other Ambulatory Visit: Payer: Self-pay | Admitting: Internal Medicine

## 2018-03-13 DIAGNOSIS — I10 Essential (primary) hypertension: Secondary | ICD-10-CM

## 2018-03-13 NOTE — Telephone Encounter (Signed)
Patient needs an appointment before refills can be authorized, please call and schedule them then route request back to me.Thanks!  

## 2018-03-13 NOTE — Telephone Encounter (Signed)
Patient has appointment scheduled for Wednesday 03/22/18.lisinopril (PRINIVIL,ZESTRIL) 20 MG tablet [[id number]]

## 2018-03-14 MED FILL — LISINOPRIL 20 MG TAB: 20 | 10 days supply | Qty: 10 | Fill #0

## 2018-03-22 ENCOUNTER — Ambulatory Visit: Payer: BLUE CROSS/BLUE SHIELD | Attending: Internal Medicine | Admitting: Physician Assistant

## 2018-03-22 DIAGNOSIS — Z79899 Other long term (current) drug therapy: Secondary | ICD-10-CM | POA: Diagnosis not present

## 2018-03-22 DIAGNOSIS — I10 Essential (primary) hypertension: Secondary | ICD-10-CM | POA: Insufficient documentation

## 2018-03-22 MED ORDER — LISINOPRIL 20 MG PO TABS: 20.0000 mg | ORAL_TABLET | Freq: Every day | ORAL | 1 refills | Status: DC

## 2018-03-22 NOTE — Progress Notes (Signed)
Patient ID: Kathryn Barrett, female   DOB: 15-May-1971, 47 y.o.   MRN: [id number]   Kathryn Barrett, is a 47 y.o. female  HYQ:[id number]  [id number]:[id number]  DOB - 12/05/1970  Subjective:  Chief Complaint and HPI: Kathryn Barrett is a 47 y.o. female here today for medication RF.  Denies any problems or complaints.  No CP/HA/dizziness.    ROS:   Constitutional:  No f/c, No night sweats, No unexplained weight loss. EENT:  No vision changes, No blurry vision, No hearing changes. No mouth, throat, or ear problems.  Respiratory: No cough, No SOB Cardiac: No CP, no palpitations GI:  No abd pain, No N/V/D. GU: No Urinary s/sx Musculoskeletal: No joint pain Neuro: No headache, no dizziness, no motor weakness.  Skin: No rash Endocrine:  No polydipsia. No polyuria.  Psych: Denies SI/HI  No problems updated.  ALLERGIES: No Known Allergies  PAST MEDICAL HISTORY: Past Medical History:  Diagnosis Date  . Hypertension     MEDICATIONS AT HOME: Prior to Admission medications   Medication Sig Start Date End Date Taking? Authorizing Provider  ALPRAZolam Prudy Feeler) 0.5 MG tablet Take 1 tablet (0.5 mg total) by mouth 2 (two) times daily as needed for anxiety. Please take one tablet prior to the scan.. Take another one if needed for claustrophobia Please have a driver to and from the testing site. Do not drive while taking this medication, it my cause drowsiness, and sedation.. 02/10/18  Yes Micki Riley, MD  ferrous sulfate (FERROUSUL) 325 (65 FE) MG tablet Take 1 tablet (325 mg total) by mouth 2 (two) times daily with a meal. 09/02/17  Yes Marcine Matar, MD  lisinopril (PRINIVIL,ZESTRIL) 20 MG tablet Take 1 tablet (20 mg total) by mouth daily. 03/22/18  Yes Alen Matheson M, PA-C  meclizine (ANTIVERT) 25 MG tablet Take 1 tablet (25 mg total) by mouth 2 (two) times daily as needed for dizziness. 09/02/17  Yes Marcine Matar, MD  phentermine (ADIPEX-P) 37.5 MG tablet TK 1 T PO QD 01/19/18  Yes  [provider]  vitamin C (VITAMIN C) 500 MG tablet Take 1 tablet (500 mg total) by mouth 2 (two) times daily. 11/08/16  Yes Tat, Onalee Hua, MD     Objective:  EXAM:   Vitals:   03/22/18 0840  BP: 110/74  Pulse: 90  Resp: 18  Temp: 97.9 F (36.6 C)  TempSrc: Oral  SpO2: 100%  Weight: 245 lb (111.1 kg)  Height: 5\' 4"  (1.626 m)    General appearance : A&OX3. NAD. Non-toxic-appearing HEENT: Atraumatic and Normocephalic.  PERRLA. EOM intact.   Neck: supple, no JVD. No cervical lymphadenopathy. No thyromegaly Chest/Lungs:  Breathing-non-labored, Good air entry bilaterally, breath sounds normal without rales, rhonchi, or wheezing  CVS: S1 S2 regular, no murmurs, gallops, rubs  Extremities: Bilateral Lower Ext shows no edema, both legs are warm to touch with = pulse throughout Neurology:  CN II-XII grossly intact, Non focal.   Psych:  TP linear. J/I WNL. Normal speech. Appropriate eye contact and affect.  Skin:  No Rash  Data Review Lab Results  Component Value Date   HGBA1C 6.0 (H) 11/02/2013     Assessment & Plan   1. Essential hypertension Controlled.  Continue current regimen.   - lisinopril (PRINIVIL,ZESTRIL) 20 MG tablet; Take 1 tablet (20 mg total) by mouth daily.  Dispense: 90 tablet; Refill: 1   Patient have been counseled extensively about nutrition and exercise  Return in about 6 months (around 09/22/2018) for Dr  Johnson for BP and fasting blood work.  The patient was given clear instructions to go to ER or return to medical center if symptoms don't improve, worsen or new problems develop. The patient verbalized understanding. The patient was told to call to get lab results if they haven't heard anything in the next week.     Georgian Co, PA-C [facility] [place], [place] [phone]   03/22/2018, 8:48 AM

## 2018-03-28 ENCOUNTER — Other Ambulatory Visit: Payer: Self-pay | Admitting: Internal Medicine

## 2018-03-28 DIAGNOSIS — I10 Essential (primary) hypertension: Secondary | ICD-10-CM

## 2018-03-28 MED FILL — LISINOPRIL 20 MG TAB: 20 | 60 days supply | Qty: 60 | Fill #0

## 2018-05-29 MED FILL — LISINOPRIL 20 MG TAB: 20 | 60 days supply | Qty: 60 | Fill #1

## 2018-07-14 MED FILL — LISINOPRIL 20 MG TAB: 20 | 60 days supply | Qty: 60 | Fill #2

## 2018-07-28 ENCOUNTER — Other Ambulatory Visit: Payer: Self-pay | Admitting: Physician Assistant

## 2018-07-28 DIAGNOSIS — Z1231 Encounter for screening mammogram for malignant neoplasm of breast: Secondary | ICD-10-CM

## 2018-08-10 ENCOUNTER — Telehealth: Payer: Self-pay

## 2018-08-10 ENCOUNTER — Ambulatory Visit: Payer: Self-pay | Admitting: Neurology

## 2018-08-10 NOTE — Telephone Encounter (Signed)
Patient no show for appt today. 

## 2018-08-14 ENCOUNTER — Encounter: Payer: Self-pay | Admitting: Neurology

## 2018-08-16 ENCOUNTER — Inpatient Hospital Stay: Payer: BLUE CROSS/BLUE SHIELD

## 2018-08-29 ENCOUNTER — Ambulatory Visit
Admission: RE | Admit: 2018-08-29 | Discharge: 2018-08-29 | Disposition: A | Discharge status: Home / Self Care | Payer: BLUE CROSS/BLUE SHIELD | Source: Ambulatory Visit | Attending: Physician Assistant | Admitting: Physician Assistant

## 2018-08-29 DIAGNOSIS — Z1231 Encounter for screening mammogram for malignant neoplasm of breast: Secondary | ICD-10-CM

## 2018-10-02 ENCOUNTER — Other Ambulatory Visit: Payer: Self-pay | Admitting: Physician Assistant

## 2018-10-02 DIAGNOSIS — I10 Essential (primary) hypertension: Secondary | ICD-10-CM

## 2018-10-06 MED FILL — LISINOPRIL 20 MG TAB: 20 | 30 days supply | Qty: 30 | Fill #0

## 2018-10-06 NOTE — Telephone Encounter (Signed)
Pt came in and reported they had no more medication left would like enough supply until her next appt

## 2018-10-13 ENCOUNTER — Other Ambulatory Visit: Payer: Self-pay | Admitting: Internal Medicine

## 2018-10-13 DIAGNOSIS — I10 Essential (primary) hypertension: Secondary | ICD-10-CM

## 2018-10-13 NOTE — Telephone Encounter (Signed)
Called to reschedule the appointment because of Covid 19 and she asked for refills on lisinopril (PRINIVIL,ZESTRIL) 20 MG tablet [[id number]]

## 2018-10-16 MED ORDER — LISINOPRIL 20 MG PO TABS: 20.0000 mg | ORAL_TABLET | Freq: Every day | ORAL | 0 refills | Status: DC

## 2018-10-17 ENCOUNTER — Ambulatory Visit: Payer: BLUE CROSS/BLUE SHIELD | Admitting: Pulmonary Disease

## 2018-10-24 MED FILL — LISINOPRIL 20 MG TABLET: 20 | 30 days supply | Qty: 30 | Fill #0

## 2018-12-06 ENCOUNTER — Other Ambulatory Visit: Payer: Self-pay | Admitting: Internal Medicine

## 2018-12-06 DIAGNOSIS — I10 Essential (primary) hypertension: Secondary | ICD-10-CM

## 2018-12-06 MED FILL — LISINOPRIL 20 MG TAB: 20 | 30 days supply | Qty: 30 | Fill #0

## 2019-01-09 ENCOUNTER — Other Ambulatory Visit: Payer: Self-pay | Admitting: Internal Medicine

## 2019-01-09 DIAGNOSIS — I10 Essential (primary) hypertension: Secondary | ICD-10-CM

## 2019-01-10 MED FILL — LISINOPRIL 20 MG TABLET: 20 | 7 days supply | Qty: 7 | Fill #0

## 2019-01-16 ENCOUNTER — Other Ambulatory Visit: Payer: Self-pay

## 2019-01-16 ENCOUNTER — Ambulatory Visit: Payer: BC Managed Care – PPO | Admitting: Internal Medicine

## 2019-01-17 ENCOUNTER — Encounter: Payer: Self-pay | Admitting: Internal Medicine

## 2019-01-17 ENCOUNTER — Ambulatory Visit: Payer: BC Managed Care – PPO | Attending: Internal Medicine | Admitting: Internal Medicine

## 2019-01-17 VITALS — BP 130/84 | HR 76 | Temp 98.8°F | Resp 18 | Ht 65.0 in | Wt 252.0 lb

## 2019-01-17 DIAGNOSIS — D508 Other iron deficiency anemias: Secondary | ICD-10-CM | POA: Diagnosis not present

## 2019-01-17 DIAGNOSIS — Z6841 Body Mass Index (BMI) 40.0 and over, adult: Secondary | ICD-10-CM | POA: Diagnosis not present

## 2019-01-17 DIAGNOSIS — I10 Essential (primary) hypertension: Secondary | ICD-10-CM | POA: Diagnosis not present

## 2019-01-17 MED ORDER — LISINOPRIL 20 MG PO TABS: 20.0000 mg | ORAL_TABLET | Freq: Every day | ORAL | 1 refills | Status: DC

## 2019-01-17 MED FILL — LISINOPRIL 20 MG TABLET: 20 | 90 days supply | Qty: 90 | Fill #0

## 2019-01-17 NOTE — Progress Notes (Signed)
Patient ID: Kathryn Barrett, female    DOB: June 15, 1971  MRN: [id number]  CC: Medication Refill   Subjective: Kathryn Barrett is a 48 y.o. female who presents for chronic disease management and medication refill Her concerns today include:  Pt with hx of HTN, obesity and anemia due to menorrhagia  HYPERTENSION Currently taking: see medication list.  She is on lisinopril.  Did not take as yet this morning because she ran out yesterday. Med Adherence: [x]  Yes    []  No Medication side effects: []  Yes    [x]  No Adherence with salt restriction: [x]  Yes    []  No Home Monitoring?: [x]  Yes    []  No Monitoring Frequency: 2 x a wk Home BP results range: 120s/80 or 130/60 SOB? []  Yes    [x]  No Chest Pain?: []  Yes    [x]  No Leg swelling?: []  Yes    [x]  No Headaches?: []  Yes    [x]  No Dizziness? []  Yes    [x]  No Comments:   Obesity: Wants to lose wgh.  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    Has a treadmill at home which she tries to use BID for about 15 mins.  She was in a weight management program and was on phentermine but has not been back to that in a while.  Anemia/Menorrhagia:  Still gets menses but they have not been as heavy as in the past..  Last 6 days.  Heavy first 3 days.  Passes clots 2nd day.  Overdue for Pap smear.  I offered to do it today while she is here.  However patient states that she is currently on menses.  Patient Active Problem List   Diagnosis Date Noted  . Class 3 severe obesity due to excess calories without serious comorbidity with body mass index (BMI) of 40.0 to 44.9 in adult ([place]) 07/07/2017  . Iron deficiency anemia due to chronic blood loss 11/08/2016  . Prediabetes 01/01/2014  . Essential hypertension 01/01/2014  . Menorrhagia 01/01/2014  . Breast lump on left side at 6 o'clock position 07/31/2013      Current Outpatient Medications on File Prior to Visit  Medication Sig Dispense Refill  . meclizine (ANTIVERT) 25 MG tablet Take 1 tablet (25 mg total) by mouth 2 (two) times daily as needed for dizziness. 30 tablet 0  . vitamin C (VITAMIN C) 500 MG tablet Take 1 tablet (500 mg total) by mouth 2 (two) times daily. 60 tablet 0   No current facility-administered medications on file prior to visit.     No Known Allergies  Social History   Socioeconomic History  . Marital status: Single    Spouse name: Not on file  . Number of children: Not on file  . Years of education: Not on file  . Highest education level: Not on file  Occupational History  . Not on file  Social Needs  . Financial resource strain: Not on file  . Food insecurity    Worry: Not on file    Inability: Not on file  . Transportation needs    Medical: Not on file    Non-medical: Not on file  Tobacco Use  . Smoking status: Never Smoker  . Smokeless tobacco: Never Used  Substance and Sexual Activity  . Alcohol use: No    Alcohol/week: 0.0 standard drinks  . Drug  use: No  . Sexual activity: Yes    Partners: Male    Birth control/protection: None  Lifestyle  . Physical activity    Days per week: Not on file    Minutes per session: Not on file  . Stress: Not on file  Relationships  . Social Musicianconnections    Talks on phone: Not on file    Gets together: Not on file    Attends religious service: Not on file    Active member of club or organization: Not on file    Attends meetings of clubs or organizations: Not on file    Relationship status: Not on file  . Intimate partner violence    Fear of current or ex partner: Not on file    Emotionally abused: Not on file    Physically abused: Not on file    Forced sexual activity: Not on file  Other Topics Concern  . Not on file  Social History Narrative  . Not on file    Family History  Problem Relation Age of Onset  . Breast cancer Mother        late 9740's  .  Diabetes Maternal Grandmother   . Diabetes Maternal Grandfather   . Diabetes Paternal Grandmother   . Diabetes Paternal Grandfather     History reviewed. No pertinent surgical history.  ROS: Review of Systems Negative except as stated above  PHYSICAL EXAM: BP 130/84 (BP Location: Left Arm, Patient Position: Sitting, Cuff Size: Normal)   Pulse 76   Temp 98.8 F (37.1 C) (Oral)   Resp 18   Ht 5\' 5"  (1.651 m)   Wt 252 lb (114.3 kg)   LMP 01/17/2019   SpO2 100%   BMI 41.93 kg/m   Wt Readings from Last 3 Encounters:  01/17/19 252 lb (114.3 kg)  03/22/18 245 lb (111.1 kg)  02/06/18 247 lb (112 kg)   Physical Exam  General appearance - alert, well appearing, obese middle-aged African-American female and in no distress Mental status - normal mood, behavior, speech, dress, motor activity, and thought processes Neck - supple, no significant adenopathy Chest - clear to auscultation, no wheezes, rales or rhonchi, symmetric air entry Heart - normal rate, regular rhythm, normal S1, S2, no murmurs, rubs, clicks or gallops Extremities - peripheral pulses normal, no pedal edema, no clubbing or cyanosis  CMP Latest Ref Rng & Units 11/08/2016 11/07/2016 01/31/2015  Glucose 65 - 99 mg/dL 782(N115(H) 98 562(Z112(H)  BUN 6 - 20 mg/dL 6 7 8   Creatinine 0.44 - 1.00 mg/dL 3.080.78 6.570.80 8.460.77  Sodium 135 - 145 mmol/L 140 139 137  Potassium 3.5 - 5.1 mmol/L 3.7 4.1 3.6  Chloride 101 - 111 mmol/L 109 107 103  CO2 22 - 32 mmol/L 24 24 27   Calcium 8.9 - 10.3 mg/dL 8.9 9.2 9.0  Total Protein 6.0 - 8.3 g/dL - - -  Total Bilirubin 0.2 - 1.2 mg/dL - - -  Alkaline Phos 39 - 117 U/L - - -  AST 0 - 37 U/L - - -  ALT 0 - 35 U/L - - -   Lipid Panel     Component Value Date/Time   CHOL 142 07/07/2017 0936   TRIG 44 07/07/2017 0936   HDL 57 07/07/2017 0936   CHOLHDL 2.5 07/07/2017 0936   CHOLHDL 3.1 11/02/2013 1026   VLDL 10 11/02/2013 1026   LDLCALC 76 07/07/2017 0936    CBC    Component Value Date/Time    WBC  5.9 07/07/2017 0936   WBC 9.9 11/08/2016 0441   RBC 4.61 07/07/2017 0936   RBC 4.97 11/08/2016 0441   HGB 12.4 07/07/2017 0936   HCT 37.5 09/02/2017 1117   PLT 225 07/07/2017 0936   MCV 82 07/07/2017 0936   MCH 26.9 07/07/2017 0936   MCH 18.3 (L) 11/08/2016 0441   MCHC 32.8 07/07/2017 0936   MCHC 30.7 11/08/2016 0441   RDW 14.9 07/07/2017 0936   LYMPHSABS 2.3 01/31/2015 1215   MONOABS 0.7 01/31/2015 1215   EOSABS 0.2 01/31/2015 1215   BASOSABS 0.0 01/31/2015 1215    ASSESSMENT AND PLAN: 1. Essential hypertension Close to goal.  She has not taken medication as yet for today.  Refill given on lisinopril.  She will stop at the lab to have some baseline blood test done.  Encouraged to continue low-salt diet - CBC - Lipid panel - Comprehensive metabolic panel - lisinopril (ZESTRIL) 20 MG tablet; Take 1 tablet (20 mg total) by mouth daily. Must make appointment for more refills.  Dispense: 90 tablet; Refill: 1  2. Class 3 severe obesity due to excess calories without serious comorbidity with body mass index (BMI) of 40.0 to 44.9 in adult [facility](HCC) I spent some time doing dietary counseling.  Encourage to cut back on white carbohydrates, eliminate sugary drinks and eat more white meat than red meat.  Encouraged to incorporate fresh fruits and vegetables into the diet. Encouraged her to exercise for at least 30 to 40 minutes 3-4 times a week. He is interested in going to medical weight management program.  Referral submitted - Amb Ref to Medical Weight Management  3. Other iron deficiency anemia We will recheck CBC today. Patient was given the opportunity to ask questions.  Patient verbalized understanding of the plan and was able to repeat key elements of the plan.   HM: Overdue for Pap.  I asked patient about coming back in 2 to 3 weeks for us to do a Pap smear.  Patient states that she will have to look at her schedule and call us back to reschedule herself.  Orders Placed This  Encounter  Procedures  . CBC  . Lipid panel  . Comprehensive metabolic panel  . Amb Ref to Medical Weight Management     Requested Prescriptions   Signed Prescriptions Disp Refills  . lisinopril (ZESTRIL) 20 MG tablet 90 tablet 1    Sig: Take 1 tablet (20 mg total) by mouth daily. Must make appointment for more refills.    Return in about 4 months (around 05/19/2019).  Jonah Blueeborah Tyheem Boughner, MD, FACP

## 2019-01-17 NOTE — Patient Instructions (Signed)
Please call and schedule an appointment at your convenience for [place] to do your Pap smear.  Otherwise I will see you back in 4 months.  You have been referred to [facility] Weight loss management program.  Follow a Healthy Eating Plan - You can do it! Limit sugary drinks.  Avoid sodas, sweet tea, sport or energy drinks, or fruit drinks.  Drink water, lo-fat milk, or diet drinks. Limit snack foods.   Cut back on candy, cake, cookies, chips, ice cream.  These are a special treat, only in small amounts. Eat plenty of vegetables.  Especially dark green, red, and orange vegetables. Aim for at least 3 servings a day. More is better! Include fruit in your daily diet.  Whole fruit is much healthier than fruit juice! Limit "white" bread, "white" pasta, "white" rice.   Choose "100% whole grain" products, brown or wild rice. Avoid fatty meats. Try "Meatless Monday" and choose eggs or beans one day a week.  When eating meat, choose lean meats like chicken, [place], and fish.  Grill, broil, or bake meats instead of frying, and eat poultry without the skin. Eat less salt.  Avoid frozen pizzas, frozen dinners and salty foods.  Use seasonings other than salt in cooking.  This can help blood pressure and keep you from swelling Beer, wine and liquor have calories.  If you can safely drink alcohol, limit to 1 drink per day for women, 2 drinks for men

## 2019-01-18 ENCOUNTER — Telehealth: Payer: Self-pay | Admitting: *Deleted

## 2019-01-18 LAB — COMPREHENSIVE METABOLIC PANEL
ALT: 19 IU/L (ref 0–32)
AST: 15 IU/L (ref 0–40)
Albumin/Globulin Ratio: 1.5 (ref 1.2–2.2)
Albumin: 4.1 g/dL (ref 3.8–4.8)
Alkaline Phosphatase: 82 IU/L (ref 39–117)
BUN/Creatinine Ratio: 8 — ABNORMAL LOW (ref 9–23)
BUN: 7 mg/dL (ref 6–24)
Bilirubin Total: 0.4 mg/dL (ref 0.0–1.2)
CO2: 21 mmol/L (ref 20–29)
Calcium: 9.3 mg/dL (ref 8.7–10.2)
Chloride: 104 mmol/L (ref 96–106)
Creatinine, Ser: 0.83 mg/dL (ref 0.57–1.00)
GFR calc Af Amer: 97 mL/min/{1.73_m2} (ref >59)
GFR calc non Af Amer: 84 mL/min/{1.73_m2} (ref >59)
Globulin, Total: 2.8 g/dL (ref 1.5–4.5)
Glucose: 89 mg/dL (ref 65–99)
Potassium: 4.5 mmol/L (ref 3.5–5.2)
Sodium: 141 mmol/L (ref 134–144)
Total Protein: 6.9 g/dL (ref 6.0–8.5)

## 2019-01-18 LAB — LIPID PANEL
Chol/HDL Ratio: 3.2 ratio (ref 0.0–4.4)
Cholesterol, Total: 150 mg/dL (ref 100–199)
HDL: 47 mg/dL (ref >39)
LDL Calculated: 92 mg/dL (ref 0–99)
Triglycerides: 55 mg/dL (ref 0–149)
VLDL Cholesterol Cal: 11 mg/dL (ref 5–40)

## 2019-01-18 LAB — CBC
Hematocrit: 37.9 % (ref 34.0–46.6)
Hemoglobin: 11.9 g/dL (ref 11.1–15.9)
MCH: 26.3 pg — ABNORMAL LOW (ref 26.6–33.0)
MCHC: 31.4 g/dL — ABNORMAL LOW (ref 31.5–35.7)
MCV: 84 fL (ref 79–97)
Platelets: 268 10*3/uL (ref 150–450)
RBC: 4.53 x10E6/uL (ref 3.77–5.28)
RDW: 13.7 % (ref 11.7–15.4)
WBC: 5.8 10*3/uL (ref 3.4–10.8)

## 2019-01-18 NOTE — Telephone Encounter (Signed)
Patient verified DOB Patient is aware of no longer being anemic and cholesterol, kidney and liver function being normal. No further questions.

## 2019-01-18 NOTE — Telephone Encounter (Signed)
-----   Message from Ladell Pier, MD sent at 01/18/2019 11:13 AM EDT ----- Let pt know that blood count is normal meaning no anemia.  Lipid profile is good.  Kidney and LFTs are normal.

## 2019-05-01 MED FILL — LISINOPRIL 20 MG TABLET: 20 | 90 days supply | Qty: 90 | Fill #1

## 2019-05-21 ENCOUNTER — Ambulatory Visit: Payer: BC Managed Care – PPO | Admitting: Internal Medicine

## 2019-08-06 ENCOUNTER — Other Ambulatory Visit: Payer: Self-pay | Admitting: Internal Medicine

## 2019-08-06 DIAGNOSIS — I10 Essential (primary) hypertension: Secondary | ICD-10-CM

## 2019-08-09 ENCOUNTER — Other Ambulatory Visit: Payer: Self-pay | Admitting: Internal Medicine

## 2019-08-09 DIAGNOSIS — I10 Essential (primary) hypertension: Secondary | ICD-10-CM

## 2019-08-09 MED FILL — LISINOPRIL 20 MG TABLET: 20 | 30 days supply | Qty: 30 | Fill #0

## 2019-08-09 NOTE — Telephone Encounter (Signed)
1) Medication(s) Requested (by name): -lisinopril (ZESTRIL) 20 MG tablet   2) Pharmacy of Choice: [facility]  3) Special Requests: -Until follow up appt

## 2019-08-20 ENCOUNTER — Ambulatory Visit: Payer: BC Managed Care – PPO | Attending: Internal Medicine | Admitting: Internal Medicine

## 2019-08-20 DIAGNOSIS — I10 Essential (primary) hypertension: Secondary | ICD-10-CM | POA: Diagnosis not present

## 2019-08-20 DIAGNOSIS — Z6841 Body Mass Index (BMI) 40.0 and over, adult: Secondary | ICD-10-CM

## 2019-08-20 MED ORDER — HYDROCHLOROTHIAZIDE 25 MG PO TABS: 25.0000 mg | ORAL_TABLET | Freq: Every day | ORAL | 1 refills | Status: DC

## 2019-08-20 MED FILL — HYDROCHLOROTHIAZIDE 25 MG T: 25 | 90 days supply | Qty: 90 | Fill #0

## 2019-08-20 NOTE — Progress Notes (Signed)
Virtual Visit via Telephone Note Due to current restrictions/limitations of in-office visits due to the COVID-19 pandemic, this scheduled clinical appointment was converted to a telehealth visit  I connected with Kathryn Barrett on 08/20/19 at 10:28 a.m by telephone and verified that I am speaking with the correct person using two identifiers. I am in my office.  The patient is at home.  Only the patient and myself participated in this encounter.  I discussed the limitations, risks, security and privacy concerns of performing an evaluation and management service by telephone and the availability of in person appointments. I also discussed with the patient that there may be a patient responsible charge related to this service. The patient expressed understanding and agreed to proceed.   History of Present Illness: Pt with hx of HTN, obesity and anemia due to menorrhagia.  Last evaluated 12/2018.  HTN: wants to try different BP med because she has swelling in feet and hands with it.  She was on Norvasc in the past and had same issue.  Limits salt in foods Checking BP QOD.  Gives range 130/60s Limits salt in foods.  No CP/SOB.    Obesity:  Last wgh in office was 252 lb in 12/2018.  Has a scale but does not check wgh. Trying to eat more bake foods and veggies.  Eat 1-2 full meals a day.  Drink many water but  still drinks sweet tea with meals   HM: She is due for Pap smear.  Was supposed to call to schedule with [place] but did not.   Outpatient Encounter Medications as of 08/20/2019  Medication Sig  . lisinopril (ZESTRIL) 20 MG tablet TAKE 1 TABLET (20 MG TOTAL) BY MOUTH DAILY. MUST MAKE APPOINTMENT FOR MORE REFILLS.  Marland Kitchen meclizine (ANTIVERT) 25 MG tablet Take 1 tablet (25 mg total) by mouth 2 (two) times daily as needed for dizziness.  . vitamin C (VITAMIN C) 500 MG tablet Take 1 tablet (500 mg total) by mouth 2 (two) times daily.   No facility-administered encounter medications on file as of  08/20/2019.    Observations/Objective: Results for orders placed or performed in visit on 01/17/19  CBC  Result Value Ref Range   WBC 5.8 3.4 - 10.8 x10E3/uL   RBC 4.53 3.77 - 5.28 x10E6/uL   Hemoglobin 11.9 11.1 - 15.9 g/dL   Hematocrit 37.9 34.0 - 46.6 %   MCV 84 79 - 97 fL   MCH 26.3 (L) 26.6 - 33.0 pg   MCHC 31.4 (L) 31.5 - 35.7 g/dL   RDW 13.7 11.7 - 15.4 %   Platelets 268 150 - 450 x10E3/uL  Lipid panel  Result Value Ref Range   Cholesterol, Total 150 100 - 199 mg/dL   Triglycerides 55 0 - 149 mg/dL   HDL 47 >39 mg/dL   VLDL Cholesterol Cal 11 5 - 40 mg/dL   LDL Calculated 92 0 - 99 mg/dL   Chol/HDL Ratio 3.2 0.0 - 4.4 ratio  Comprehensive metabolic panel  Result Value Ref Range   Glucose 89 65 - 99 mg/dL   BUN 7 6 - 24 mg/dL   Creatinine, Ser 0.83 0.57 - 1.00 mg/dL   GFR calc non Af Amer 84 >59 mL/min/1.73   GFR calc Af Amer 97 >59 mL/min/1.73   BUN/Creatinine Ratio 8 (L) 9 - 23   Sodium 141 134 - 144 mmol/L   Potassium 4.5 3.5 - 5.2 mmol/L   Chloride 104 96 - 106 mmol/L   CO2 21  20 - 29 mmol/L   Calcium 9.3 8.7 - 10.2 mg/dL   Total Protein 6.9 6.0 - 8.5 g/dL   Albumin 4.1 3.8 - 4.8 g/dL   Globulin, Total 2.8 1.5 - 4.5 g/dL   Albumin/Globulin Ratio 1.5 1.2 - 2.2   Bilirubin Total 0.4 0.0 - 1.2 mg/dL   Alkaline Phosphatase 82 39 - 117 IU/L   AST 15 0 - 40 IU/L   ALT 19 0 - 32 IU/L     Assessment and Plan: 1. Essential hypertension Reports Lisinopril causing swelling in hands/feet.  Pt agreeable to change to HCTZ.  Advise to come to lab after being on med for 1 wk for BMP - hydrochlorothiazide (HYDRODIURIL) 25 MG tablet; Take 1 tablet (25 mg total) by mouth daily.  Dispense: 90 tablet; Refill: 1 - Basic Metabolic Panel; Future  2. Class 3 severe obesity due to excess calories without serious comorbidity with body mass index (BMI) of 40.0 to 44.9 in adult [facility]) -dietary counseling given. Advise to eliminate sugar drinks from diet Regular exercise encouraged.     HM:  Pt agreeable for appt for pap.  Follow Up Instructions: 7-8 wks for pap and recheck BP   I discussed the assessment and treatment plan with the patient. The patient was provided an opportunity to ask questions and all were answered. The patient agreed with the plan and demonstrated an understanding of the instructions.   The patient was advised to call back or seek an in-person evaluation if the symptoms worsen or if the condition fails to improve as anticipated.  I provided 12 minutes of non-face-to-face time during this encounter.   Jonah Blue, MD

## 2019-08-20 NOTE — Progress Notes (Signed)
Pt is requesting have lisinopril switched  Pt states she is having a lot of edema in hands and feet due to the lisinopril

## 2019-09-12 MED FILL — HYDROCHLOROTHIAZIDE 25 MG T: 25 | 90 days supply | Qty: 90 | Fill #0

## 2019-11-16 ENCOUNTER — Other Ambulatory Visit: Payer: Self-pay | Admitting: Internal Medicine

## 2019-11-16 DIAGNOSIS — Z1231 Encounter for screening mammogram for malignant neoplasm of breast: Secondary | ICD-10-CM

## 2019-11-19 ENCOUNTER — Ambulatory Visit
Admission: RE | Admit: 2019-11-19 | Discharge: 2019-11-19 | Disposition: A | Discharge status: Home / Self Care | Payer: BC Managed Care – PPO | Source: Ambulatory Visit | Attending: Internal Medicine | Admitting: Internal Medicine

## 2019-11-19 ENCOUNTER — Other Ambulatory Visit: Payer: Self-pay

## 2019-11-19 DIAGNOSIS — Z1231 Encounter for screening mammogram for malignant neoplasm of breast: Secondary | ICD-10-CM

## 2019-11-20 ENCOUNTER — Telehealth: Payer: Self-pay

## 2019-11-20 NOTE — Telephone Encounter (Signed)
Contacted pt to go over mm results pt is aware and doesn't have any questions or concerns  

## 2019-12-05 ENCOUNTER — Ambulatory Visit: Payer: Self-pay | Attending: Internal Medicine

## 2019-12-05 ENCOUNTER — Other Ambulatory Visit: Payer: Self-pay

## 2019-12-05 DIAGNOSIS — I10 Essential (primary) hypertension: Secondary | ICD-10-CM

## 2019-12-06 ENCOUNTER — Telehealth: Payer: Self-pay | Admitting: Internal Medicine

## 2019-12-06 ENCOUNTER — Telehealth: Payer: Self-pay

## 2019-12-06 LAB — BASIC METABOLIC PANEL
BUN/Creatinine Ratio: 13 (ref 9–23)
BUN: 13 mg/dL (ref 6–24)
CO2: 22 mmol/L (ref 20–29)
Calcium: 9.5 mg/dL (ref 8.7–10.2)
Chloride: 102 mmol/L (ref 96–106)
Creatinine, Ser: 0.98 mg/dL (ref 0.57–1.00)
GFR calc Af Amer: 79 mL/min/{1.73_m2} (ref >59)
GFR calc non Af Amer: 68 mL/min/{1.73_m2} (ref >59)
Glucose: 104 mg/dL — ABNORMAL HIGH (ref 65–99)
Potassium: 4.1 mmol/L (ref 3.5–5.2)
Sodium: 140 mmol/L (ref 134–144)

## 2019-12-06 NOTE — Telephone Encounter (Signed)
Contacted pt to go over lab results pt didn't answer left a detailed vm informing pt of results and if she has any questions or concerns to give a call  

## 2019-12-06 NOTE — Telephone Encounter (Signed)
Patient called in and requested for lab results. Patient was identified by 2 patient identifiers. Results were given. Patient verbalized understanding and had no further questions or concerns at this time.

## 2019-12-18 MED FILL — HYDROCHLOROTHIAZIDE 25 MG T: 25 | 90 days supply | Qty: 90 | Fill #1

## 2020-04-04 ENCOUNTER — Other Ambulatory Visit: Payer: Self-pay | Admitting: Internal Medicine

## 2020-04-04 DIAGNOSIS — I10 Essential (primary) hypertension: Secondary | ICD-10-CM

## 2020-04-04 MED FILL — HYDROCHLOROTHIAZIDE 25 MG T: 25 | 90 days supply | Qty: 90 | Fill #0

## 2020-07-31 MED FILL — HYDROCHLOROTHIAZIDE 25 MG T: 25 | 90 days supply | Qty: 90 | Fill #1

## 2020-09-04 ENCOUNTER — Ambulatory Visit: Payer: Self-pay | Attending: Internal Medicine

## 2020-09-04 DIAGNOSIS — Z23 Encounter for immunization: Secondary | ICD-10-CM

## 2020-09-04 NOTE — Progress Notes (Signed)
   Covid-19 Vaccination Clinic  Name:  Surina Storts    MRN: [id number] DOB: [birth year]  09/04/2020  Ms. Delucia was observed post Covid-19 immunization for 15 minutes without incident. She was provided with Vaccine Information Sheet and instruction to access the V-Safe system.   Ms. Hunkins was instructed to call 911 with any severe reactions post vaccine: Marland Kitchen Difficulty breathing  . Swelling of face and throat  . A fast heartbeat  . A bad rash all over body  . Dizziness and weakness   Immunizations Administered    Name Date Dose VIS Date Route   PFIZER Comrnaty(Gray TOP) Covid-19 Vaccine 09/04/2020 12:11 PM 0.3 mL 07/03/2020 Intramuscular   Manufacturer: [employer], [employer]   Lot: [id number]   NDC: [phone]

## 2020-10-17 ENCOUNTER — Other Ambulatory Visit: Payer: Self-pay

## 2020-10-17 DIAGNOSIS — Z1231 Encounter for screening mammogram for malignant neoplasm of breast: Secondary | ICD-10-CM

## 2020-10-21 ENCOUNTER — Telehealth: Payer: Self-pay | Admitting: Internal Medicine

## 2020-10-21 DIAGNOSIS — Z1211 Encounter for screening for malignant neoplasm of colon: Secondary | ICD-10-CM

## 2020-10-21 NOTE — Telephone Encounter (Signed)
Copied from CRM [id number]. Topic: Referral - Request for Referral >> Oct 17, 2020  3:33 PM Gaetana Michaelis A wrote: Has patient seen PCP for this complaint? No  *If NO, is insurance requiring patient see PCP for this issue before PCP can refer them?  Referral for which specialty: GI  Preferred provider/office: Patient does not have a preference  Reason for referral: Patient would like a colonoscopy

## 2020-10-22 NOTE — Telephone Encounter (Signed)
Will forward to provider  

## 2020-12-03 ENCOUNTER — Other Ambulatory Visit: Payer: Self-pay | Admitting: Internal Medicine

## 2020-12-03 DIAGNOSIS — I10 Essential (primary) hypertension: Secondary | ICD-10-CM

## 2020-12-03 NOTE — Telephone Encounter (Signed)
Requested medications are due for refill today.  yes  Requested medications are on the active medications list.  yes  Last refill. 04/04/2020  Future visit scheduled.   no  Notes to clinic.  Patient is more than 3 months overdue for office visit.

## 2020-12-09 ENCOUNTER — Other Ambulatory Visit: Payer: Self-pay | Admitting: Internal Medicine

## 2020-12-09 ENCOUNTER — Other Ambulatory Visit: Payer: Self-pay

## 2020-12-09 DIAGNOSIS — I10 Essential (primary) hypertension: Secondary | ICD-10-CM

## 2020-12-09 MED ORDER — HYDROCHLOROTHIAZIDE 25 MG PO TABS
ORAL_TABLET | Freq: Every day | ORAL | 0 refills | Status: DC
  Filled 2020-12-09: qty 30, 30d supply, fill #0
  Filled 2021-01-14 – 2021-01-22 (×2): qty 27, 27d supply, fill #1

## 2020-12-09 NOTE — Telephone Encounter (Signed)
Courtesy refill. No valid encounter within 6 months. Future visit scheduled in 1 month .

## 2020-12-10 ENCOUNTER — Ambulatory Visit
Admission: RE | Admit: 2020-12-10 | Discharge: 2020-12-10 | Disposition: A | Discharge status: Home / Self Care | Payer: No Typology Code available for payment source | Source: Ambulatory Visit | Attending: Internal Medicine | Admitting: Internal Medicine

## 2020-12-10 ENCOUNTER — Other Ambulatory Visit: Payer: Self-pay

## 2020-12-10 DIAGNOSIS — Z1231 Encounter for screening mammogram for malignant neoplasm of breast: Secondary | ICD-10-CM

## 2020-12-15 ENCOUNTER — Telehealth: Payer: Self-pay

## 2020-12-15 NOTE — Telephone Encounter (Signed)
Contacted pt to go over MM results pt is aware and doesn't have any questions or concerns  

## 2021-01-14 ENCOUNTER — Other Ambulatory Visit: Payer: Self-pay

## 2021-01-21 ENCOUNTER — Other Ambulatory Visit: Payer: Self-pay

## 2021-01-22 ENCOUNTER — Other Ambulatory Visit: Payer: Self-pay

## 2021-02-05 ENCOUNTER — Other Ambulatory Visit: Payer: Self-pay

## 2021-02-05 ENCOUNTER — Encounter (INDEPENDENT_AMBULATORY_CARE_PROVIDER_SITE_OTHER): Payer: Self-pay

## 2021-02-05 ENCOUNTER — Ambulatory Visit: Payer: Self-pay | Attending: Internal Medicine | Admitting: Internal Medicine

## 2021-02-05 VITALS — BP 127/82 | HR 76 | Resp 16 | Ht 65.5 in | Wt 249.4 lb

## 2021-02-05 DIAGNOSIS — Z1211 Encounter for screening for malignant neoplasm of colon: Secondary | ICD-10-CM

## 2021-02-05 DIAGNOSIS — I10 Essential (primary) hypertension: Secondary | ICD-10-CM

## 2021-02-05 DIAGNOSIS — Z6841 Body Mass Index (BMI) 40.0 and over, adult: Secondary | ICD-10-CM

## 2021-02-05 DIAGNOSIS — K5901 Slow transit constipation: Secondary | ICD-10-CM

## 2021-02-05 MED ORDER — POLYETHYLENE GLYCOL 3350 17 G PO PACK
17.0000 g | PACK | Freq: Every day | ORAL | 3 refills | Status: AC | PRN
  Filled 2021-02-05: qty 30, 30d supply, fill #0

## 2021-02-05 MED ORDER — HYDROCHLOROTHIAZIDE 25 MG PO TABS
ORAL_TABLET | Freq: Every day | ORAL | 3 refills | Status: DC
  Filled 2021-02-05: qty 90, fill #0
  Filled 2021-02-23: qty 30, 30d supply, fill #0
  Filled 2021-03-24: qty 90, 90d supply, fill #1
  Filled 2021-03-24: qty 30, 30d supply, fill #1
  Filled 2021-06-30: qty 90, 90d supply, fill #2
  Filled 2021-10-08: qty 90, 90d supply, fill #3
  Filled 2021-10-09: qty 30, 30d supply, fill #0
  Filled 2021-11-06: qty 30, 30d supply, fill #1
  Filled 2021-12-14: qty 30, 30d supply, fill #2

## 2021-02-05 NOTE — Progress Notes (Addendum)
Patient ID: Kathryn Barrett, female    DOB: 1970-11-19  MRN: [id number]  CC: Hypertension and Medication Refill   Subjective: Kathryn Barrett is a 50 y.o. female who presents for chronic ds management Her concerns today include:  Pt with hx of HTN, obesity and anemia due to menorrhagia.    Last seen 07/2019.  Pt states appts were reschedule several times  HTN:  taking HCTZ but wants to know what she has to do to be able to get off the medication eventually.  No chest pains or shortness of breath.  No lower extremity edema.  Obesity: trying to lose wgh.  Lowest she got was 230 lbs.   Eats once a day but snacks on chips. Drinks Pepsi, sweet tea, water and Triple [employer] No exercise outside of work.  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  C/o constipated.  Sometimes it can be days before she has a bowel movement.  Bowel movements are not hard.  No blood in stools.  Taking Milk Mg Q 2-3 days so that she can have regular bowel movements.  HM:  referred for c-scopy in March 2022 but she has lost insurance since then.  No fhx of colon CA. due for Pap smear but states she is currently on her cycle. Patient Active Problem List   Diagnosis Date Noted   Class 3 severe obesity due to excess calories without serious comorbidity with body mass index (BMI) of 40.0 to 44.9 in adult [facility]) 07/07/2017   Iron deficiency anemia due to chronic blood loss 11/08/2016   Prediabetes 01/01/2014   Essential hypertension 01/01/2014   Menorrhagia 01/01/2014   Breast lump on left side at 6 o'clock position 07/31/2013     Current Outpatient Medications on File Prior to Visit  Medication Sig Dispense Refill   meclizine (ANTIVERT) 25 MG tablet Take 1 tablet (25 mg total) by mouth 2 (two) times daily as needed for dizziness. 30 tablet 0   No current facility-administered medications on file prior to  visit.    Allergies  Allergen Reactions   Lisinopril Swelling    Edema in hands and feet   Norvasc [Amlodipine] Swelling    Social History   Socioeconomic History   Marital status: Single    Spouse name: Not on file   Number of children: Not on file   Years of education: Not on file   Highest education level: Not on file  Occupational History   Not on file  Tobacco Use   Smoking status: Never   Smokeless tobacco: Never  Substance and Sexual Activity   Alcohol use: No    Alcohol/week: 0.0 standard drinks   Drug use: No   Sexual activity: Yes    Partners: Male    Birth control/protection: None  Other Topics Concern   Not on file  Social History Narrative   Not on file   Social Determinants of Health   Financial Resource Strain: Not on file  Food Insecurity: Not on file  Transportation Needs: Not on file  Physical Activity: Not on file  Stress: Not on file  Social Connections: Not on file  Intimate Partner Violence: Not on file    Family History  Problem Relation Age of Onset   Breast cancer Mother        late 47's   Diabetes Maternal Grandmother    Diabetes  Maternal Grandfather    Diabetes Paternal Grandmother    Diabetes Paternal Grandfather     No past surgical history on file.  ROS: Review of Systems Negative except as stated above  PHYSICAL EXAM: BP 127/82   Pulse 76   Resp 16   Ht 5' 5.5" (1.664 m)   Wt 249 lb 6.4 oz (113.1 kg)   SpO2 100%   BMI 40.87 kg/m   Wt Readings from Last 3 Encounters:  02/05/21 249 lb 6.4 oz (113.1 kg)  01/17/19 252 lb (114.3 kg)  03/22/18 245 lb (111.1 kg)    Physical Exam  General appearance - alert, well appearing, obese middle-age African-American female and in no distress Mental status - normal mood, behavior, speech, dress, motor activity, and thought processes Neck - supple, no significant adenopathy Chest - clear to auscultation, no wheezes, rales or rhonchi, symmetric air entry Heart - normal  rate, regular rhythm, normal S1, S2, no murmurs, rubs, clicks or gallops Extremities - peripheral pulses normal, no pedal edema, no clubbing or cyanosis   CMP Latest Ref Rng & Units 12/05/2019 01/17/2019 11/08/2016  Glucose 65 - 99 mg/dL 258(N) 89 277(O)  BUN 6 - 24 mg/dL 13 7 6   Creatinine 0.57 - 1.00 mg/dL 2.42 3.53  Sodium 134 - 144 mmol/L 140 141 140  Potassium 3.5 - 5.2 mmol/L 4.1 4.5 3.7  Chloride 96 - 106 mmol/L 102 104 109  CO2 20 - 29 mmol/L 22 21 24   Calcium 8.7 - 10.2 mg/dL 9.5 9.3 8.9  Total Protein 6.0 - 8.5 g/dL - 6.9 -  Total Bilirubin 0.0 - 1.2 mg/dL - 0.4 -  Alkaline Phos 39 - 117 IU/L - 82 -  AST 0 - 40 IU/L - 15 -  ALT 0 - 32 IU/L - 19 -   Lipid Panel     Component Value Date/Time   CHOL 150 01/17/2019 1055   TRIG 55 01/17/2019 1055   HDL 47 01/17/2019 1055   CHOLHDL 3.2 01/17/2019 1055   CHOLHDL 3.1 11/02/2013 1026   VLDL 10 11/02/2013 1026   LDLCALC 92 01/17/2019 1055    CBC    Component Value Date/Time   WBC 5.8 01/17/2019 1055   WBC 9.9 11/08/2016 0441   RBC 4.53 01/17/2019 1055   RBC 4.97 11/08/2016 0441   HGB 11.9 01/17/2019 1055   HCT 37.9 01/17/2019 1055   PLT 268 01/17/2019 1055   MCV 84 01/17/2019 1055   MCH 26.3 (L) 01/17/2019 1055   MCH 18.3 (L) 11/08/2016 0441   MCHC 31.4 (L) 01/17/2019 1055   MCHC 30.7 11/08/2016 0441   RDW 13.7 01/17/2019 1055   LYMPHSABS 2.3 01/31/2015 1215   MONOABS 0.7 01/31/2015 1215   EOSABS 0.2 01/31/2015 1215   BASOSABS 0.0 01/31/2015 1215    ASSESSMENT AND PLAN: 1. Essential hypertension Close to goal.  Continue HCTZ.  Encouraged her to continue DASH diet.  I think if she is able to lose significant weight, she may be able to come off blood pressure medication. - CBC - Comprehensive metabolic panel - Lipid panel - hydrochlorothiazide (HYDRODIURIL) 25 MG tablet; TAKE 1 TABLET (25 MG TOTAL) BY MOUTH DAILY.  Dispense: 90 tablet; Refill: 3  2. Class 3 severe obesity due to excess calories without  serious comorbidity with body mass index (BMI) of 40.0 to 44.9 in adult [facility]) Dietary counseling given. Advised to eliminate sugary drinks from her diet including the sodas and sweet tea.  Try to drink more  water.  Advised to eat more lean white meat instead of red meat, cut back on portion sizes of white carbs, incorporate fresh fruits and vegetables into the diet and eat healthier snacks.  Encouraged her to get in some form of moderate intensity exercise at least 150 minutes/week total.  3. Slow transit constipation Recommend increasing fiber in the diet.  Stop milk of magnesia.  I will prescribe some MiraLAX to use as needed. - polyethylene glycol (MIRALAX) 17 g packet; Take 17 g by mouth daily as needed.  Dispense: 30 each; Refill: 3  4. Screening for colon cancer Since she does not have insurance coverage at this time, she is agreeable to doing the fit test for colon cancer screening. - Fecal occult blood, imunochemical([facility])  Addendum:  when we were through with the visit, patient inquired whether she should still be taking iron or not given her history of anemia.  Advised patient that we will check her blood count when we do blood test today and get back to her with the results.  She also wanted to know whether she should be taking vitamin C.  I told her that the vitamin C was likely previously prescribed to be taken with iron in increase the iron absorption from the stomach.  Patient was given the opportunity to ask questions.  Patient verbalized understanding of the plan and was able to repeat key elements of the plan.   Orders Placed This Encounter  Procedures   Fecal occult blood, imunochemical([facility])   CBC   Comprehensive metabolic panel   Lipid panel     Requested Prescriptions   Signed Prescriptions Disp Refills   hydrochlorothiazide (HYDRODIURIL) 25 MG tablet 90 tablet 3    Sig: TAKE 1 TABLET (25 MG TOTAL) BY MOUTH DAILY.   polyethylene glycol  (MIRALAX) 17 g packet 30 each 3    Sig: Take 17 g by mouth daily as needed.    Return in about 6 weeks (around 03/19/2021) for PAP.  Jonah Blue, MD, FACP

## 2021-02-05 NOTE — Patient Instructions (Signed)
Obesity, Adult Obesity is having too much body fat. Being obese means that your weight is morethan what is healthy for you. BMI is a number that explains how much body fat you have. If you have a BMI of 30 or more, you are obese. Obesity is often caused by eating or drinking morecalories than your body uses. Changing your lifestyle can help you lose weight. Obesity can cause serious health problems, such as: Stroke. Coronary artery disease (CAD). Type 2 diabetes. Some types of cancer, including cancers of the colon, breast, uterus, and gallbladder. Osteoarthritis. High blood pressure (hypertension). High cholesterol. Sleep apnea. Gallbladder stones. Infertility problems. What are the causes? Eating meals each day that are high in calories, sugar, and fat. Being born with genes that may make you more likely to become obese. Having a medical condition that causes obesity. Taking certain medicines. Sitting a lot (having a sedentary lifestyle). Not getting enough sleep. Drinking a lot of drinks that have sugar in them. What increases the risk? Having a family history of obesity. Being an African American woman. Being a Hispanic man. Living in an area with limited access to: Parks, recreation centers, or sidewalks. Healthy food choices, such as grocery stores and farmers' markets. What are the signs or symptoms? The main sign is having too much body fat. How is this treated? Treatment for this condition often includes changing your lifestyle. Treatment may include: Changing your diet. This may include making a healthy meal plan. Exercise. This may include activity that causes your heart to beat faster (aerobic exercise) and strength training. Work with your doctor to design a program that works for you. Medicine to help you lose weight. This may be used if you are not able to lose 1 pound a week after 6 weeks of healthy eating and more exercise. Treating conditions that cause the  obesity. Surgery. Options may include gastric banding and gastric bypass. This may be done if: Other treatments have not helped to improve your condition. You have a BMI of 40 or higher. You have life-threatening health problems related to obesity. Follow these instructions at home: Eating and drinking  Follow advice from your doctor about what to eat and drink. Your doctor may tell you to: Limit fast food, sweets, and processed snack foods. Choose low-fat options. For example, choose low-fat milk instead of whole milk. Eat 5 or more servings of fruits or vegetables each day. Eat at home more often. This gives you more control over what you eat. Choose healthy foods when you eat out. Learn to read food labels. This will help you learn how much food is in 1 serving. Keep low-fat snacks available. Avoid drinks that have a lot of sugar in them. These include soda, fruit juice, iced tea with sugar, and flavored milk. Drink enough water to keep your pee (urine) pale yellow. Do not go on fad diets.  Physical activity Exercise often, as told by your doctor. Most adults should get up to 150 minutes of moderate-intensity exercise every week.Ask your doctor: What types of exercise are safe for you. How often you should exercise. Warm up and stretch before being active. Do slow stretching after being active (cool down). Rest between times of being active. Lifestyle Work with your doctor and a food expert (dietitian) to set a weight-loss goal that is best for you. Limit your screen time. Find ways to reward yourself that do not involve food. Do not drink alcohol if: Your doctor tells you not to drink.   You are pregnant, may be pregnant, or are planning to become pregnant. If you drink alcohol: Limit how much you use to: 0-1 drink a day for women. 0-2 drinks a day for men. Be aware of how much alcohol is in your drink. In the [place] one drink equals one 12 oz bottle of beer (355 mL), one 5 oz  glass of wine (148 mL), or one 1 oz glass of hard liquor (44 mL). General instructions Keep a weight-loss journal. This can help you keep track of: The food that you eat. How much exercise you get. Take over-the-counter and prescription medicines only as told by your doctor. Take vitamins and supplements only as told by your doctor. Think about joining a support group. Keep all follow-up visits as told by your doctor. This is important. Contact a doctor if: You cannot meet your weight loss goal after you have changed your diet and lifestyle for 6 weeks. Get help right away if you: Are having trouble breathing. Are having thoughts of harming yourself. Summary Obesity is having too much body fat. Being obese means that your weight is more than what is healthy for you. Work with your doctor to set a weight-loss goal. Get regular exercise as told by your doctor. This information is not intended to replace advice given to you by your health care provider. Make sure you discuss any questions you have with your healthcare provider. Document Revised: 03/16/2018 Document Reviewed: 03/16/2018 Elsevier Patient Education  2022 Elsevier Inc.  

## 2021-02-06 ENCOUNTER — Other Ambulatory Visit: Payer: Self-pay

## 2021-02-06 ENCOUNTER — Telehealth: Payer: Self-pay | Admitting: Internal Medicine

## 2021-02-06 DIAGNOSIS — N920 Excessive and frequent menstruation with regular cycle: Secondary | ICD-10-CM

## 2021-02-06 DIAGNOSIS — D500 Iron deficiency anemia secondary to blood loss (chronic): Secondary | ICD-10-CM

## 2021-02-06 LAB — COMPREHENSIVE METABOLIC PANEL
ALT: 11 IU/L (ref 0–32)
AST: 10 IU/L (ref 0–40)
Albumin/Globulin Ratio: 1.1 — ABNORMAL LOW (ref 1.2–2.2)
Albumin: 3.9 g/dL (ref 3.8–4.8)
Alkaline Phosphatase: 102 IU/L (ref 44–121)
BUN/Creatinine Ratio: 12 (ref 9–23)
BUN: 11 mg/dL (ref 6–24)
Bilirubin Total: 0.3 mg/dL (ref 0.0–1.2)
CO2: 23 mmol/L (ref 20–29)
Calcium: 9.2 mg/dL (ref 8.7–10.2)
Chloride: 102 mmol/L (ref 96–106)
Creatinine, Ser: 0.92 mg/dL (ref 0.57–1.00)
Globulin, Total: 3.5 g/dL (ref 1.5–4.5)
Glucose: 92 mg/dL (ref 65–99)
Potassium: 4.6 mmol/L (ref 3.5–5.2)
Sodium: 141 mmol/L (ref 134–144)
Total Protein: 7.4 g/dL (ref 6.0–8.5)
eGFR: 76 mL/min/{1.73_m2} (ref >59)

## 2021-02-06 LAB — CBC
Hematocrit: 24.7 % — ABNORMAL LOW (ref 34.0–46.6)
Hemoglobin: 6.4 g/dL — CL (ref 11.1–15.9)
MCH: 14.9 pg — ABNORMAL LOW (ref 26.6–33.0)
MCHC: 25.9 g/dL — ABNORMAL LOW (ref 31.5–35.7)
MCV: 58 fL — ABNORMAL LOW (ref 79–97)
Platelets: 372 10*3/uL (ref 150–450)
RBC: 4.29 x10E6/uL (ref 3.77–5.28)
RDW: 22.4 % — ABNORMAL HIGH (ref 11.7–15.4)
WBC: 6 10*3/uL (ref 3.4–10.8)

## 2021-02-06 LAB — LIPID PANEL
Chol/HDL Ratio: 3.1 ratio (ref 0.0–4.4)
Cholesterol, Total: 125 mg/dL (ref 100–199)
HDL: 40 mg/dL (ref >39)
LDL Chol Calc (NIH): 74 mg/dL (ref 0–99)
Triglycerides: 47 mg/dL (ref 0–149)
VLDL Cholesterol Cal: 11 mg/dL (ref 5–40)

## 2021-02-06 MED ORDER — FERROUS SULFATE 325 (65 FE) MG PO TABS
325.0000 mg | ORAL_TABLET | Freq: Two times a day (BID) | ORAL | 3 refills | Status: DC
  Filled 2021-02-06: qty 60, 30d supply, fill #0
  Filled 2021-03-09: qty 60, 30d supply, fill #1
  Filled 2021-03-10 (×2): qty 180, 90d supply, fill #1
  Filled 2021-07-28: qty 180, 90d supply, fill #2
  Filled 2021-08-03 – 2021-08-24 (×3): qty 60, 30d supply, fill #0
  Filled 2021-10-08: qty 60, 30d supply, fill #1
  Filled 2021-11-06: qty 60, 30d supply, fill #2
  Filled 2022-02-04: qty 60, 30d supply, fill #3

## 2021-02-06 NOTE — Telephone Encounter (Signed)
Phone call placed to patient today regarding her lab results.  Patient informed that she is very anemic with a hemoglobin of 6.4.  Normal for women should be between 12-16.  Patient denies any dizziness or fatigue at this time.  She has history of iron deficiency anemia secondary to menorrhagia.  Patient was on iron in the past and states that she continue to take it but not consistently because I had never told her whether she needed to continue taking it consistently.  She endorses that her menstrual cycles are still very heavy lasting 7 to 9 days with a lot of clots. I told patient that if she develops any dizziness or fatigue she should be seen in the emergency room to get blood transfusion.  However since she is asymptomatic at this time, we will go ahead and restart oral iron.  Advised that she takes it twice a day.  I will also order a pelvic ultrasound to evaluate for fibroids or intrauterine polyps.  Recommend that after she has been on the iron supplement for at least 3 weeks, that she returns to the lab to have blood cell count repeated.  Recommend that she keep her follow-up appointment with me to have her Pap smear done.  Patient expressed understanding of the plan.  Results for orders placed or performed in visit on 02/05/21  CBC  Result Value Ref Range   WBC 6.0 3.4 - 10.8 x10E3/uL   RBC 4.29 3.77 - 5.28 x10E6/uL   Hemoglobin 6.4 (LL) 11.1 - 15.9 g/dL   Hematocrit 24.7 (L) 34.0 - 46.6 %   MCV 58 (L) 79 - 97 fL   MCH 14.9 (L) 26.6 - 33.0 pg   MCHC 25.9 (L) 31.5 - 35.7 g/dL   RDW 22.4 (H) 11.7 - 15.4 %   Platelets 372 150 - 450 x10E3/uL   Hematology Comments: Note:   Comprehensive metabolic panel  Result Value Ref Range   Glucose 92 65 - 99 mg/dL   BUN 11 6 - 24 mg/dL   Creatinine, Ser 0.92 0.57 - 1.00 mg/dL   eGFR 76 >59 mL/min/1.73   BUN/Creatinine Ratio 12 9 - 23   Sodium 141 134 - 144 mmol/L   Potassium 4.6 3.5 - 5.2 mmol/L   Chloride 102 96 - 106 mmol/L   CO2 23 20 - 29  mmol/L   Calcium 9.2 8.7 - 10.2 mg/dL   Total Protein 7.4 6.0 - 8.5 g/dL   Albumin 3.9 3.8 - 4.8 g/dL   Globulin, Total 3.5 1.5 - 4.5 g/dL   Albumin/Globulin Ratio 1.1 (L) 1.2 - 2.2   Bilirubin Total 0.3 0.0 - 1.2 mg/dL   Alkaline Phosphatase 102 44 - 121 IU/L   AST 10 0 - 40 IU/L   ALT 11 0 - 32 IU/L  Lipid panel  Result Value Ref Range   Cholesterol, Total 125 100 - 199 mg/dL   Triglycerides 47 0 - 149 mg/dL   HDL 40 >39 mg/dL   VLDL Cholesterol Cal 11 5 - 40 mg/dL   LDL Chol Calc (NIH) 74 0 - 99 mg/dL   Chol/HDL Ratio 3.1 0.0 - 4.4 ratio

## 2021-02-06 NOTE — Telephone Encounter (Signed)
Contacted pt to go over appt information for [place] . Pt didn't answer lvm letting pt know appointment info    Ultrasound is scheduled for 02/13/21 at 230pm at [facility] 1st floor radiology. Pt will need a full bladder

## 2021-02-13 ENCOUNTER — Ambulatory Visit (HOSPITAL_COMMUNITY): Payer: Self-pay

## 2021-02-23 ENCOUNTER — Other Ambulatory Visit: Payer: Self-pay

## 2021-03-02 NOTE — Telephone Encounter (Signed)
Pt is calling in to have her lab scheduled. Pt says that she was told by provider to, returns to the lab to have blood cell count repeated in 3 weeks. Pt is due. Will assist pt with scheduling per her request.   Please place order so that pt can have completed.  I also reminded pt to have her F/U with PCP in September per previously advised.

## 2021-03-03 ENCOUNTER — Other Ambulatory Visit: Payer: Self-pay

## 2021-03-03 DIAGNOSIS — D500 Iron deficiency anemia secondary to blood loss (chronic): Secondary | ICD-10-CM

## 2021-03-03 NOTE — Telephone Encounter (Signed)
Order has been placed for CBC in future orders

## 2021-03-04 ENCOUNTER — Other Ambulatory Visit: Payer: No Typology Code available for payment source

## 2021-03-05 ENCOUNTER — Ambulatory Visit: Payer: Self-pay | Attending: Internal Medicine

## 2021-03-05 ENCOUNTER — Other Ambulatory Visit: Payer: Self-pay

## 2021-03-05 DIAGNOSIS — D500 Iron deficiency anemia secondary to blood loss (chronic): Secondary | ICD-10-CM

## 2021-03-06 LAB — CBC
Hematocrit: 33.8 % — ABNORMAL LOW (ref 34.0–46.6)
Hemoglobin: 9.8 g/dL — ABNORMAL LOW (ref 11.1–15.9)
MCH: 20.2 pg — ABNORMAL LOW (ref 26.6–33.0)
MCHC: 29 g/dL — ABNORMAL LOW (ref 31.5–35.7)
MCV: 70 fL — ABNORMAL LOW (ref 79–97)
Platelets: 308 10*3/uL (ref 150–450)
RBC: 4.85 x10E6/uL (ref 3.77–5.28)
WBC: 5.1 10*3/uL (ref 3.4–10.8)

## 2021-03-06 NOTE — Progress Notes (Signed)
Let patient know that she is still anemic but her hemoglobin level has improved from 6.4 now to 9.8.  Normal hemoglobin in women is 12-16.  She should continue the iron supplement.  I had ordered a pelvic ultrasound on her a month ago.  It has not been scheduled.  Please schedule for her.

## 2021-03-10 ENCOUNTER — Other Ambulatory Visit: Payer: Self-pay

## 2021-03-11 ENCOUNTER — Telehealth: Payer: Self-pay

## 2021-03-11 NOTE — Telephone Encounter (Signed)
-----   Message from Marcine Matar, MD sent at 03/06/2021  7:46 AM EDT ----- Let patient know that she is still anemic but her hemoglobin level has improved from 6.4 now to 9.8.  Normal hemoglobin in women is 12-16.  She should continue the iron supplement.  I had ordered a pelvic ultrasound on her a month ago.  It has not been scheduled.  Please schedule for her.

## 2021-03-11 NOTE — Telephone Encounter (Signed)
Patient name and DOB has been verified Patient was informed of lab results. Patient had no questions.   Pt states that she will call back to schedule her [place].

## 2021-03-11 NOTE — Telephone Encounter (Signed)
Patient was called and a voicemail was left informing patient to return phone call for lab results. 

## 2021-03-24 ENCOUNTER — Other Ambulatory Visit: Payer: Self-pay

## 2021-03-26 ENCOUNTER — Other Ambulatory Visit: Payer: Self-pay

## 2021-04-10 ENCOUNTER — Ambulatory Visit: Payer: No Typology Code available for payment source | Admitting: Internal Medicine

## 2021-04-13 ENCOUNTER — Ambulatory Visit: Payer: No Typology Code available for payment source | Admitting: Internal Medicine

## 2021-04-13 ENCOUNTER — Other Ambulatory Visit: Payer: No Typology Code available for payment source

## 2021-05-28 ENCOUNTER — Ambulatory Visit: Payer: No Typology Code available for payment source | Admitting: Internal Medicine

## 2021-05-29 ENCOUNTER — Telehealth: Payer: Self-pay | Admitting: Internal Medicine

## 2021-05-29 NOTE — Telephone Encounter (Signed)
Returned pt call and made aware that she will need to come by the office to pick up another FIT test and bring it back to [place] once she completes it

## 2021-05-29 NOTE — Telephone Encounter (Signed)
Copied from CRM [id number]. Topic: General - Call Back - No Documentation >> May 27, 2021  4:16 PM Randol Kern wrote: Reason for CRM: Pt is requesting another cologuard ki, please advise when she can come by and retrieve it.   Best contact: [phone]  Not sure if this patient came by the office already.

## 2021-06-19 ENCOUNTER — Ambulatory Visit: Payer: Self-pay

## 2021-06-19 NOTE — Telephone Encounter (Signed)
Pt called in stating she has numbness in her arms and legs that has been going on for several months now. She says they are consistent happening every day if not every other. When symptoms are present she has hot flash and feels heart racing and at times feels like she going to faint. EMS has been out to home and work and she states they told her may be panic attack related. Pt requesting appt. Scheduled appt 06/22/21 at 1330 with Cari, PA. Care advice given and pt verbalized understanding. No other questions/concerns noted.    Reason for Disposition  [1] Weakness of arm / hand, or leg / foot AND [2] is a chronic symptom (recurrent or ongoing AND present > 4 weeks)  Answer Assessment - Initial Assessment Questions 1. SYMPTOM: "What is the main symptom you are concerned about?" (e.g., weakness, numbness)     Numbness in legs and arms 2. ONSET: "When did this start?" (minutes, hours, days; while sleeping)     Few months 3. LAST NORMAL: "When was the last time you (the patient) were normal (no symptoms)?"    Been awhile 4. PATTERN "Does this come and go, or has it been constant since it started?"  "Is it present now?"     Comes and goes and constant 5. CARDIAC SYMPTOMS: "Have you had any of the following symptoms: chest pain, difficulty breathing, palpitations?"     Palpitations  6. NEUROLOGIC SYMPTOMS: "Have you had any of the following symptoms: headache, dizziness, vision loss, double vision, changes in speech, unsteady on your feet?"     no 7. OTHER SYMPTOMS: "Do you have any other symptoms?"     Hot flashes and feels like passed 8. PREGNANCY: "Is there any chance you are pregnant?" "When was your last menstrual period?"     This month  Protocols used: Neurologic Deficit-A-AH

## 2021-06-22 ENCOUNTER — Encounter: Payer: Self-pay | Admitting: Physician Assistant

## 2021-06-22 ENCOUNTER — Ambulatory Visit: Payer: Self-pay | Attending: Physician Assistant | Admitting: Physician Assistant

## 2021-06-22 ENCOUNTER — Other Ambulatory Visit: Payer: Self-pay

## 2021-06-22 VITALS — BP 133/85 | HR 87 | Temp 98.9°F | Resp 18 | Ht 67.0 in | Wt 257.0 lb

## 2021-06-22 DIAGNOSIS — I10 Essential (primary) hypertension: Secondary | ICD-10-CM

## 2021-06-22 DIAGNOSIS — R202 Paresthesia of skin: Secondary | ICD-10-CM

## 2021-06-22 DIAGNOSIS — Z6841 Body Mass Index (BMI) 40.0 and over, adult: Secondary | ICD-10-CM

## 2021-06-22 DIAGNOSIS — E559 Vitamin D deficiency, unspecified: Secondary | ICD-10-CM

## 2021-06-22 DIAGNOSIS — F439 Reaction to severe stress, unspecified: Secondary | ICD-10-CM | POA: Insufficient documentation

## 2021-06-22 DIAGNOSIS — N951 Menopausal and female climacteric states: Secondary | ICD-10-CM | POA: Insufficient documentation

## 2021-06-22 DIAGNOSIS — D500 Iron deficiency anemia secondary to blood loss (chronic): Secondary | ICD-10-CM

## 2021-06-22 DIAGNOSIS — R200 Anesthesia of skin: Secondary | ICD-10-CM | POA: Insufficient documentation

## 2021-06-22 MED ORDER — HYDROXYZINE HCL 10 MG PO TABS
10.0000 mg | ORAL_TABLET | Freq: Three times a day (TID) | ORAL | 0 refills | Status: DC | PRN
  Filled 2021-06-22 – 2021-06-29 (×2): qty 30, 10d supply, fill #0

## 2021-06-22 NOTE — Progress Notes (Signed)
Established Patient Office Visit  Subjective:  Patient ID: Kathryn Barrett, female    DOB: 11/15/1970  Age: 50 y.o. MRN: [id number]  CC:  Chief Complaint  Patient presents with   Numbness    HPI Kathryn Barrett reports that she has been having numbness in her right foot intermittently, states that it starts without any known cause, and states that it will start spreading up her leg.  She states that she also will have the same feeling in her left hand, spreading up her left arm.  States that they are independent of each other.  Reports that this has been present for several years but recently has been more frequent.  Reports that she has previously been seen by neurology for same concern, last office visit was in July 2019.  Note from that visit:  Reason for Referral:  Leg numbness HPI: Initial visit 03/09/2016 60 year young [place] American lady is seen today for initial  office consultation. She is accompanied today by her mother. She states he developed sudden onset of left foot numbness on 02/29/16. Patient started in the bottom of the foot and over 1 minute spread to involve the left leg to below the knee. This lasted about an hour and a half and gradually went away. She was able to walk but The foot was numb and had to be careful. She denied any fall, back pain, radicular pain or injury. She had somewhat similar but milder episode 3 days ago when only the bottom of the foot felt numb this lasted only a few minutes and resolved. She is unable to identify specific triggers. She denies any habit of having chills, sweating cross legged. She has had no previous back problems of degenerative spine disease. She is pretty healthy except mild anemia and hypertension which appears to be well controlled. She denies any headaches, loss of vision, blurred vision, double vision, vertigo, gait or balance problems. She has no history of strokes, TIA, seizures, significant head injury or loss of consciousness.  She has had no recent weight or appetite changes. Update 11/30/2017 She returns for follow-up after initial visit nearly 2 years ago.:  Patient was advised to undergo brain imaging and EMG nerve conduction study at that visit but chose not to do it since she did not have insurance.  She states her numbness actually improved and went away for about 6 months.  For the last 4 to 5 months however she is now complaining of intermittent numbness in the lateral aspect of the right foot as well as the right and forearm.  This is brought on without any specific triggers and can last from minutes to hours and occurs on a variable basis.  She does not describe this as being annoying or a hard to tolerate.  On inquiry she also admits to history of intermittent vertigo and dizziness which has been occurring off and on for a month.  This is not usually positional but that she get worse in certain head positions.  She has been prescribed meclizine by primary physician but seems to be helping.  She denies any diplopia, focal extremity weakness gait or balance problems.  She denies excessive fatigue or tiredness.  She denies any bladder urgency incontinence.  She complains of mild headaches that she has had off-and-on.  She describes as frontal or involving the vertex moderate in intensity 7/10 and pressure-like in quality.  There are no specific triggers for the headaches.  Tylenol or Motrin seems to get  rid of the headaches.  She denies prior history of migraines.  She denies any visual symptoms accompanying her headaches.  She returns now since she has health insurance and would like more detailed evaluation of her condition. Update 02/06/2018 : She returns for follow-up after last visit 2 months ago. She states she's had no further numbness episodes. She complains of occasional minor occipital headache which is short lasting. She had MRI scan of the brain; thoracic spine done on 12/15/17 which are personally reviewed. MRI scan of  the brain shows nonspecific periventricular white matter hyperintensities with a wide differential. MRI scan as lichen thoracic spine were unremarkable. MRA of the brain shows no aneurysm there is a filling defect in the mid basilar artery which is likely an artifact and is present less than 50% stenosis. Lab works for B12, TSH, RPR, HIV, ANA and ESR were all normal. The patient denies any symptoms suggestive of multiple sclerosis in the form of blurred vision loss of vision, vertigo, diplopia, fatigue or bladder urgency.  ASSESSMENT: 50 year old obese young African-American lady with intermittent left foot and leg numbness  And vertigo of unclear etiology. Brain MRI scan shows nonspecific white matter hyperintensities but she does not meet clinical criteria for demyelinating disease at the present time. PLAN: I had a long discussion with the patient regarding her symptoms of numbness which appeared to have resolved and discussed imaging results from a brain and spinal imaging and discussed differential diagnosis and lab work results. I recommend repeating MRI scan the brain with contrast to look for any enhancing lesions. We also discussed briefly need for diagnostic spinal tap to confirm possible multiple sclerosis. The patient appeared reluctant at the present time. Advise her to call me if she has any new neurological symptoms suggestive of multiple sclerosis. She'll return for follow-up in 6 months and will repeat brain MRI at that visit or call earlier if necessary.   States today that she was unable to follow-up due to insurance, and unfortunately is currently uninsured and having to pay out-of-pocket.  Reports that she is also having intermittent hot flushes.  Reports that she is able to step outside in the cold weather with relief.  Reports that she continues taking her blood pressure medication and iron on a daily basis.  States that she continues to have menses, however they continue to be  irregular, states that this is not abnormal for her.  States that she does not drink much water at all, mostly drinks Pepsi and tea.  Reports that her sleep is poor during the workweek, is only sleeping approximately 4 hours a night, states that she does rest more on the weekends.  Does endorse a high stress level due to work.  Reports that she was driving home this weekend and did have to pull over, states that she felt she was having a hard time breathing.     Past Medical History:  Diagnosis Date   Hypertension     History reviewed. No pertinent surgical history.  Family History  Problem Relation Age of Onset   Breast cancer Mother        late 75's   Diabetes Maternal Grandmother    Diabetes Maternal Grandfather    Diabetes Paternal Grandmother    Diabetes Paternal Grandfather     Social History   Socioeconomic History   Marital status: Single    Spouse name: Not on file   Number of children: Not on file   Years of education: Not  on file   Highest education level: Not on file  Occupational History   Not on file  Tobacco Use   Smoking status: Never   Smokeless tobacco: Never  Substance and Sexual Activity   Alcohol use: No    Alcohol/week: 0.0 standard drinks   Drug use: No   Sexual activity: Yes    Partners: Male    Birth control/protection: None  Other Topics Concern   Not on file  Social History Narrative   Not on file   Social Determinants of Health   Financial Resource Strain: Not on file  Food Insecurity: Not on file  Transportation Needs: Not on file  Physical Activity: Not on file  Stress: Not on file  Social Connections: Not on file  Intimate Partner Violence: Not on file    Outpatient Medications Prior to Visit  Medication Sig Dispense Refill   ferrous sulfate 325 (65 FE) MG tablet Take 1 tablet (325 mg total) by mouth 2 (two) times daily with a meal. 120 tablet 3   hydrochlorothiazide (HYDRODIURIL) 25 MG tablet TAKE 1 TABLET (25 MG TOTAL)  BY MOUTH DAILY. 90 tablet 3   meclizine (ANTIVERT) 25 MG tablet Take 1 tablet (25 mg total) by mouth 2 (two) times daily as needed for dizziness. 30 tablet 0   polyethylene glycol (MIRALAX) 17 g packet Take 17 g by mouth daily as needed. 30 each 3   No facility-administered medications prior to visit.    Allergies  Allergen Reactions   Lisinopril Swelling    Edema in hands and feet   Norvasc [Amlodipine] Swelling    ROS Review of Systems  Constitutional: Negative.   HENT: Negative.    Eyes: Negative.   Respiratory:  Negative for shortness of breath and wheezing.   Cardiovascular:  Negative for chest pain and palpitations.  Gastrointestinal: Negative.   Endocrine: Negative.   Genitourinary: Negative.   Musculoskeletal: Negative.   Skin: Negative.   Allergic/Immunologic: Negative.   Neurological:  Positive for numbness. Negative for dizziness, seizures, speech difficulty, weakness and headaches.  Hematological: Negative.   Psychiatric/Behavioral:  Positive for sleep disturbance. Negative for dysphoric mood, self-injury and suicidal ideas. The patient is nervous/anxious.      Objective:    Physical Exam Vitals and nursing note reviewed.  Constitutional:      Appearance: Normal appearance. She is obese.  HENT:     Head: Normocephalic and atraumatic.     Right Ear: External ear normal.     Left Ear: External ear normal.     Nose: Nose normal.     Mouth/Throat:     Mouth: Mucous membranes are moist.     Pharynx: Oropharynx is clear.  Eyes:     Extraocular Movements: Extraocular movements intact.     Conjunctiva/sclera: Conjunctivae normal.     Pupils: Pupils are equal, round, and reactive to light.  Cardiovascular:     Rate and Rhythm: Normal rate and regular rhythm.     Pulses: Normal pulses.     Heart sounds: Normal heart sounds.  Musculoskeletal:        General: Normal range of motion.     Cervical back: Normal range of motion and neck supple.  Skin:    General:  Skin is warm and dry.  Neurological:     General: No focal deficit present.     Mental Status: She is alert and oriented to person, place, and time.  Psychiatric:        Mood and  Affect: Mood normal.        Behavior: Behavior normal.        Thought Content: Thought content normal.        Judgment: Judgment normal.    BP 133/85 (BP Location: Left Arm, Patient Position: Sitting, Cuff Size: Normal)   Pulse 87   Temp 98.9 F (37.2 C) (Oral)   Resp 18   Ht 5' 7"  (1.702 m)   Wt 257 lb (116.6 kg)   LMP 05/29/2021   SpO2 100%   BMI 40.25 kg/m  Wt Readings from Last 3 Encounters:  06/22/21 257 lb (116.6 kg)  02/05/21 249 lb 6.4 oz (113.1 kg)  01/17/19 252 lb (114.3 kg)     Health Maintenance Due  Topic Date Due   Hepatitis C Screening  Never done   TETANUS/TDAP  Never done   PAP SMEAR-Modifier  Never done   COLONOSCOPY (Pts 45-33yr Insurance coverage will need to be confirmed)  Never done   COVID-19 Vaccine (2 - Pfizer series) 09/25/2020   INFLUENZA VACCINE  Never done   Zoster Vaccines- Shingrix (1 of 2) Never done    There are no preventive care reminders to display for this patient.  Lab Results  Component Value Date   TSH 1.690 11/30/2017   Lab Results  Component Value Date   WBC 5.1 03/05/2021   HGB 9.8 (L) 03/05/2021   HCT 33.8 (L) 03/05/2021   MCV 70 (L) 03/05/2021   PLT 308 03/05/2021   Lab Results  Component Value Date   NA 141 02/05/2021   K 4.6 02/05/2021   CO2 23 02/05/2021   GLUCOSE 92 02/05/2021   BUN 11 02/05/2021   CREATININE 0.92 02/05/2021   BILITOT 0.3 02/05/2021   ALKPHOS 102 02/05/2021   AST 10 02/05/2021   ALT 11 02/05/2021   PROT 7.4 02/05/2021   ALBUMIN 3.9 02/05/2021   CALCIUM 9.2 02/05/2021   ANIONGAP 7 11/08/2016   EGFR 76 02/05/2021   Lab Results  Component Value Date   CHOL 125 02/05/2021   Lab Results  Component Value Date   HDL 40 02/05/2021   Lab Results  Component Value Date   LDLCALC 74 02/05/2021   Lab  Results  Component Value Date   TRIG 47 02/05/2021   Lab Results  Component Value Date   CHOLHDL 3.1 02/05/2021   Lab Results  Component Value Date   HGBA1C 6.0 (H) 11/02/2013      Assessment & Plan:   Problem List Items Addressed This Visit       Cardiovascular and Mediastinum   Essential hypertension (Chronic)   Hot flushes, perimenopausal     Other   Iron deficiency anemia due to chronic blood loss (Chronic)   Relevant Orders   CBC with Differential/Platelet   Iron, TIBC and Ferritin Panel   Class 3 severe obesity due to excess calories with serious comorbidity and body mass index (BMI) of 40.0 to 44.9 in adult (HCC)   Numbness of right foot - Primary   Relevant Orders   TSH   Vitamin D, 25-hydroxy   Ambulatory referral to Neurology   Numbness and tingling of left hand   Stress   Relevant Medications   hydrOXYzine (ATARAX/VISTARIL) 10 MG tablet    Meds ordered this encounter  Medications   hydrOXYzine (ATARAX/VISTARIL) 10 MG tablet    Sig: Take 1 tablet (10 mg total) by mouth 3 (three) times daily as needed.    Dispense:  30 tablet  Refill:  0    Order Specific Question:   Supervising Provider    Answer:   Joya Gaskins, PATRICK E [[id number]]   1. Numbness of right foot Patient given application for [place] financial assistance.  Patient encouraged to increase water intake, work on improving stress management, sleep hygiene.  Decrease caffeine intake - TSH - Vitamin D, 25-hydroxy - Ambulatory referral to Neurology  2. Numbness and tingling of left hand   3. Stress Trial hydroxyzine, patient education given on lifestyle modifications - hydrOXYzine (ATARAX/VISTARIL) 10 MG tablet; Take 1 tablet (10 mg total) by mouth 3 (three) times daily as needed.  Dispense: 30 tablet; Refill: 0  4. Hot flushes, perimenopausal   5. Iron deficiency anemia due to chronic blood loss  - CBC with Differential/Platelet - Iron, TIBC and Ferritin Panel  6. Essential  hypertension   7. Class 3 severe obesity due to excess calories with serious comorbidity and body mass index (BMI) of 40.0 to 44.9 in adult [facility])    I have reviewed the patient's medical history (PMH, PSH, Social History, Family History, Medications, and allergies) , and have been updated if relevant. I spent 30 minutes reviewing chart and  face to face time with patient.    Follow-up: Return if symptoms worsen or fail to improve, for needs CAFA application.    Loraine Grip Mayers, PA-C

## 2021-06-22 NOTE — Progress Notes (Signed)
Patient last took medication yesterday. Patient has eaten today. Patient denies pain or numbness the past 2 days. Last episode occurring Friday and Saturday.

## 2021-06-22 NOTE — Patient Instructions (Signed)
I encourage you to increase your water intake, you should be drinking at least 64 ounces of water a day.  I encourage you to decrease your caffeine intake.  You can try using hydroxyzine 10 mg every 8 hours as needed if you feel your stress level is elevated.  We will call you with your lab results.  Kennieth Rad, PA-C Physician Assistant [facility] [email or web address]   Managing Stress, Adult Feeling a certain amount of stress is normal. Stress helps our body and mind get ready to deal with the demands of life. Stress hormones can motivate you to do well at work and meet your responsibilities. But severe or long-term (chronic) stress can affect your mental and physical health. Chronic stress puts you at higher risk for: Anxiety and depression. Other health problems such as digestive problems, muscle aches, heart disease, high blood pressure, and stroke. What are the causes? Common causes of stress include: Demands from work, such as deadlines, feeling overworked, or having long hours. Pressures at home, such as money issues, disagreements with a spouse, or parenting issues. Pressures from major life changes, such as divorce, moving, loss of a loved one, or chronic illness. You may be at higher risk for stress-related problems if you: Do not get enough sleep. Are in poor health. Do not have emotional support. Have a mental health disorder such as anxiety or depression. How to recognize stress Stress can make you: Have trouble sleeping. Feel sad, anxious, irritable, or overwhelmed. Lose your appetite. Overeat or want to eat unhealthy foods. Want to use drugs or alcohol. Stress can also cause physical symptoms, such as: Sore, tense muscles, especially in the shoulders and neck. Headaches. Trouble breathing. A faster heart rate. Stomach pain, nausea, or vomiting. Diarrhea or constipation. Trouble concentrating. Follow  these instructions at home: Eating and drinking Eat a healthy diet. This includes: Eating foods that are high in fiber, such as beans, whole grains, and fresh fruits and vegetables. Limiting foods that are high in fat and processed sugars, such as fried or sweet foods. Do not skip meals or overeat. Drink enough fluid to keep your urine pale yellow. Alcohol use Do not drink alcohol if: Your health care provider tells you not to drink. You are pregnant, may be pregnant, or are planning to become pregnant. Drinking alcohol is a way some people try to ease their stress. This can be dangerous, so if you drink alcohol: Limit how much you have to: 0-1 drink a day for women. 0-2 drinks a day for men. Know how much alcohol is in your drink. In the [place] one drink equals one 12 oz bottle of beer (355 mL), one 5 oz glass of wine (148 mL), or one 1 oz glass of hard liquor (44 mL). Activity  Include 30 minutes of exercise in your daily schedule. Exercise is a good stress reducer. Include time in your day for an activity that you find relaxing. Try taking a walk, going on a bike ride, reading a book, or listening to music. Schedule your time in a way that lowers stress, and keep a regular schedule. Focus on doing what is most important to get done. Lifestyle Identify the source of your stress and your reaction to it. See a therapist who can help you change unhelpful reactions. When there are stressful events: Talk about them with family, friends, or coworkers. Try to think realistically about stressful events and not ignore them or overreact. Try to find the positives in  a stressful situation and not focus on the negatives. Cut back on responsibilities at work and home, if possible. Ask for help from friends or family members if you need it. Find ways to manage stress, such as: Mindfulness, meditation, or deep breathing. Yoga or tai chi. Progressive muscle relaxation. Spending time in  nature. Doing art, playing music, or reading. Making time for fun activities. Spending time with family and friends. Get support from family, friends, or spiritual resources. General instructions Get enough sleep. Try to go to sleep and get up at about the same time every day. Take over-the-counter and prescription medicines only as told by your health care provider. Do not use any products that contain nicotine or tobacco. These products include cigarettes, chewing tobacco, and vaping devices, such as e-cigarettes. If you need help quitting, ask your health care provider. Do not use drugs or smoke to deal with stress. Keep all follow-up visits. This is important. Where to find support Talk with your health care provider about stress management or finding a support group. Find a therapist to work with you on your stress management techniques. Where to find more information [employer] on Mental Illness: www.nami.org American Psychological Association: TVStere[employer].ch Contact a health care provider if: Your stress symptoms get worse. You are unable to manage your stress at home. You are struggling to stop using drugs or alcohol. Get help right away if: You may be a danger to yourself or others. You have any thoughts of death or suicide. Get help right awayif you feel like you may hurt yourself or others, or have thoughts about taking your own life. Go to your nearest emergency room or: Call 911. Call the [facility] at 864-164-0410 or 988 in the [place] This is open 24 hours a day. Text the Crisis Text Line at (303)876-5782. Summary Feeling a certain amount of stress is normal, but severe or long-term (chronic) stress can affect your mental and physical health. Chronic stress can put you at higher risk for anxiety, depression, and other health problems such as digestive problems, muscle aches, heart disease, high blood pressure, and stroke. You may be at higher risk  for stress-related problems if you do not get enough sleep, are in poor health, lack emotional support, or have a mental health disorder such as anxiety or depression. Identify the source of your stress and your reaction to it. Try talking about stressful events with family, friends, or coworkers, finding a coping method, or getting support from spiritual resources. If you need more help, talk with your health care provider about finding a support group or a mental health therapist. This information is not intended to replace advice given to you by your health care provider. Make sure you discuss any questions you have with your health care provider. Document Revised: 02/05/2021 Document Reviewed: 02/03/2021 Elsevier Patient Education  [place].

## 2021-06-23 ENCOUNTER — Other Ambulatory Visit: Payer: Self-pay

## 2021-06-23 LAB — IRON,TIBC AND FERRITIN PANEL
Ferritin: 13 ng/mL — ABNORMAL LOW (ref 15–150)
Iron Saturation: 7 % — CL (ref 15–55)
Iron: 28 ug/dL (ref 27–159)
Total Iron Binding Capacity: 376 ug/dL (ref 250–450)
UIBC: 348 ug/dL (ref 131–425)

## 2021-06-23 LAB — CBC WITH DIFFERENTIAL/PLATELET
Basophils Absolute: 0.1 10*3/uL (ref 0.0–0.2)
Basos: 1 %
EOS (ABSOLUTE): 0.2 10*3/uL (ref 0.0–0.4)
Eos: 4 %
Hematocrit: 37.1 % (ref 34.0–46.6)
Hemoglobin: 11.6 g/dL (ref 11.1–15.9)
Immature Grans (Abs): 0 10*3/uL (ref 0.0–0.1)
Immature Granulocytes: 0 %
Lymphocytes Absolute: 2.3 10*3/uL (ref 0.7–3.1)
Lymphs: 38 %
MCH: 25.2 pg — ABNORMAL LOW (ref 26.6–33.0)
MCHC: 31.3 g/dL — ABNORMAL LOW (ref 31.5–35.7)
MCV: 81 fL (ref 79–97)
Monocytes Absolute: 0.6 10*3/uL (ref 0.1–0.9)
Monocytes: 10 %
Neutrophils Absolute: 2.8 10*3/uL (ref 1.4–7.0)
Neutrophils: 47 %
Platelets: 285 10*3/uL (ref 150–450)
RBC: 4.6 x10E6/uL (ref 3.77–5.28)
RDW: 14.1 % (ref 11.7–15.4)
WBC: 6.1 10*3/uL (ref 3.4–10.8)

## 2021-06-23 LAB — VITAMIN D 25 HYDROXY (VIT D DEFICIENCY, FRACTURES): Vit D, 25-Hydroxy: 12.4 ng/mL — ABNORMAL LOW (ref 30.0–100.0)

## 2021-06-23 LAB — TSH: TSH: 2.78 u[IU]/mL (ref 0.450–4.500)

## 2021-06-23 MED ORDER — VITAMIN D (ERGOCALCIFEROL) 1.25 MG (50000 UNIT) PO CAPS
50000.0000 [IU] | ORAL_CAPSULE | ORAL | 2 refills | Status: AC
  Filled 2021-06-23: qty 4, 28d supply, fill #0

## 2021-06-23 NOTE — Addendum Note (Signed)
Addended by: Roney Jaffe on: 06/23/2021 01:27 PM   Modules accepted: Orders

## 2021-06-29 ENCOUNTER — Other Ambulatory Visit: Payer: Self-pay

## 2021-06-30 ENCOUNTER — Telehealth: Payer: Self-pay | Admitting: *Deleted

## 2021-06-30 NOTE — Telephone Encounter (Signed)
-----   Message from Roney Jaffe, [place] sent at 06/23/2021  1:26 PM EST ----- Please call patient and let her know that her iron continues to be low, she did state that she is taking the iron twice daily, recent studies do show that taking it twice a day may lead to your body absorbing less iron and it is better to actually take it once a day.  I do encourage her to do that trial for the next 6 weeks of taking iron once daily instead of twice daily and see if this improves.  Her hemoglobin count is stable.  She should have this rechecked in 6 weeks  Her vitamin D is also low, she needs to take 50,000 units once a week for the next 12 weeks.  Prescription sent to her pharmacy  Her thyroid function is within normal limits.

## 2021-06-30 NOTE — Telephone Encounter (Signed)
Patient verified DOB Patient is aware needing to take iron once a day and taking once a week vitamin d.

## 2021-07-01 ENCOUNTER — Other Ambulatory Visit: Payer: Self-pay

## 2021-07-01 NOTE — Telephone Encounter (Signed)
MA LVM informing patient of liquid vitamin being OTC and needing to take it daily to equal the 50,000 units needed weekly.

## 2021-07-05 LAB — FECAL OCCULT BLOOD, IMMUNOCHEMICAL: Fecal Occult Bld: NEGATIVE

## 2021-07-07 ENCOUNTER — Telehealth: Payer: Self-pay | Admitting: Internal Medicine

## 2021-07-07 NOTE — Telephone Encounter (Signed)
Ergocalciferol at the 50,000 units dose is not available in a tablet only capsule. Pt states she will not be able to take and wants to know if OTC vit D gummies are okay for her to take. I told her this should be fine but will route to PCP to make sure.

## 2021-07-07 NOTE — Telephone Encounter (Signed)
Pt called and stated she never heard back about her vitamin D / pt had an Rx for Vitamin D, Ergocalciferol, (DRISDOL) 1.25 MG (50000 UNIT) CAPS capsule / pt is not able to take capsules and asked if this can be resent in a Tablet form / please advise asap   Send to [facility]

## 2021-07-08 NOTE — Telephone Encounter (Signed)
Patient called and informed

## 2021-07-21 ENCOUNTER — Emergency Department (HOSPITAL_BASED_OUTPATIENT_CLINIC_OR_DEPARTMENT_OTHER)
Admission: EM | Admit: 2021-07-21 | Discharge: 2021-07-21 | Disposition: A | Discharge status: Home / Self Care | Payer: No Typology Code available for payment source | Attending: Student | Admitting: Student

## 2021-07-21 ENCOUNTER — Encounter (HOSPITAL_BASED_OUTPATIENT_CLINIC_OR_DEPARTMENT_OTHER): Payer: Self-pay | Admitting: *Deleted

## 2021-07-21 ENCOUNTER — Other Ambulatory Visit: Payer: Self-pay

## 2021-07-21 DIAGNOSIS — K029 Dental caries, unspecified: Secondary | ICD-10-CM | POA: Insufficient documentation

## 2021-07-21 DIAGNOSIS — Z79899 Other long term (current) drug therapy: Secondary | ICD-10-CM | POA: Insufficient documentation

## 2021-07-21 DIAGNOSIS — K047 Periapical abscess without sinus: Secondary | ICD-10-CM | POA: Insufficient documentation

## 2021-07-21 DIAGNOSIS — K0402 Irreversible pulpitis: Secondary | ICD-10-CM | POA: Insufficient documentation

## 2021-07-21 DIAGNOSIS — I10 Essential (primary) hypertension: Secondary | ICD-10-CM | POA: Insufficient documentation

## 2021-07-21 DIAGNOSIS — K0889 Other specified disorders of teeth and supporting structures: Secondary | ICD-10-CM

## 2021-07-21 MED ORDER — AMOXICILLIN-POT CLAVULANATE 875-125 MG PO TABS: 1.0000 | ORAL_TABLET | Freq: Two times a day (BID) | ORAL | 0 refills | Status: DC

## 2021-07-21 MED ORDER — IBUPROFEN 600 MG PO TABS: 600.0000 mg | ORAL_TABLET | Freq: Four times a day (QID) | ORAL | 0 refills | Status: AC | PRN

## 2021-07-21 MED ORDER — HYDROCODONE-ACETAMINOPHEN 5-325 MG PO TABS: 2.0000 | ORAL_TABLET | Freq: Four times a day (QID) | ORAL | 0 refills | Status: AC | PRN

## 2021-07-21 NOTE — ED Provider Notes (Signed)
[facility] EMERGENCY DEPARTMENT Provider Note   CSN: [id number] Arrival date & time: 07/21/21  2103     History Chief Complaint  Patient presents with   Dental Pain    Kathryn Barrett is a 50 y.o. female presenting with dental pain.  She reports that this pain started yesterday however this morning she felt as though there was an abscess to the left upper area of her mouth.  Denies fever or chills.  Has had multiple teeth pulled by the dentist, has not seen a dentist in 2 years.  No difficulty swallowing however pain is worse with chewing so she is having difficulty eating.   Past Medical History:  Diagnosis Date   Hypertension     Patient Active Problem List   Diagnosis Date Noted   Numbness of right foot 06/22/2021   Numbness and tingling of left hand 06/22/2021   Stress 06/22/2021   Hot flushes, perimenopausal 06/22/2021   Class 3 severe obesity due to excess calories with serious comorbidity and body mass index (BMI) of 40.0 to 44.9 in adult [facility]) 07/07/2017   Iron deficiency anemia due to chronic blood loss 11/08/2016   Prediabetes 01/01/2014   Essential hypertension 01/01/2014   Menorrhagia 01/01/2014   Breast lump on left side at 6 o'clock position 07/31/2013    History reviewed. No pertinent surgical history.   OB History     Gravida  0   Para      Term      Preterm      AB      Living         SAB      IAB      Ectopic      Multiple      Live Births              Family History  Problem Relation Age of Onset   Breast cancer Mother        late 61's   Diabetes Maternal Grandmother    Diabetes Maternal Grandfather    Diabetes Paternal Grandmother    Diabetes Paternal Grandfather     Social History   Tobacco Use   Smoking status: Never   Smokeless tobacco: Never  Substance Use Topics   Alcohol use: No    Alcohol/week: 0.0 standard drinks   Drug use: No    Home Medications Prior to Admission medications    Medication Sig Start Date End Date Taking? Authorizing Provider  ferrous sulfate 325 (65 FE) MG tablet Take 1 tablet (325 mg total) by mouth 2 (two) times daily with a meal. 02/06/21  Yes Marcine Matar, MD  hydrochlorothiazide (HYDRODIURIL) 25 MG tablet TAKE 1 TABLET (25 MG TOTAL) BY MOUTH DAILY. 02/05/21 02/05/22 Yes Marcine Matar, MD  hydrOXYzine (ATARAX) 10 MG tablet Take 1 tablet (10 mg total) by mouth 3 (three) times daily as needed. 06/22/21   Mayers, Cari S, PA-C  meclizine (ANTIVERT) 25 MG tablet Take 1 tablet (25 mg total) by mouth 2 (two) times daily as needed for dizziness. 09/02/17   Marcine Matar, MD  polyethylene glycol (MIRALAX) 17 g packet Take 17 g by mouth daily as needed. 02/05/21   Marcine Matar, MD  Vitamin D, Ergocalciferol, (DRISDOL) 1.25 MG (50000 UNIT) CAPS capsule Take 1 capsule (50,000 Units total) by mouth every 7 (seven) days. 06/23/21   Mayers, Cari S, PA-C    Allergies    Lisinopril and Norvasc [amlodipine]  Review of Systems  Review of Systems  Constitutional:  Negative for chills and fever.  HENT:  Positive for dental problem. Negative for sore throat and trouble swallowing.   Respiratory:  Negative for shortness of breath.    Physical Exam Updated Vital Signs BP (!) 144/86 (BP Location: Right Arm)    Pulse 80    Temp 98.3 F (36.8 C) (Oral)    Resp 14    Ht 5\' 7"  (1.702 m)    Wt 116.6 kg    LMP 06/30/2021    SpO2 100%    BMI 40.26 kg/m   Physical Exam Vitals and nursing note reviewed.  Constitutional:      Appearance: Normal appearance.  HENT:     Head: Normocephalic and atraumatic.     Mouth/Throat:     Mouth: Mucous membranes are moist.     Dentition: Dental tenderness, gingival swelling and dental caries present.     Pharynx: Oropharynx is clear. No posterior oropharyngeal erythema.     Tonsils: No tonsillar exudate or tonsillar abscesses.      Comments: Pulpitis and small abscess Eyes:     General: No scleral icterus.     Conjunctiva/sclera: Conjunctivae normal.  Pulmonary:     Effort: Pulmonary effort is normal. No respiratory distress.  Skin:    General: Skin is warm and dry.     Findings: No rash.  Neurological:     Mental Status: She is alert.  Psychiatric:        Mood and Affect: Mood normal.    ED Results / Procedures / Treatments   Labs (all labs ordered are listed, but only abnormal results are displayed) Labs Reviewed - No data to display  EKG None  Radiology No results found.  Procedures Procedures   Medications Ordered in ED Medications - No data to display  ED Course  I have reviewed the triage vital signs and the nursing notes.  Pertinent labs & imaging results that were available during my care of the patient were reviewed by me and considered in my medical decision making (see chart for details).    MDM Rules/Calculators/A&P 50 year old female presenting with dental pain.  Small abscess at the inside gumline, pulpitis noted around molars of upper and lower mouth.  Airway clear, tolerating secretions.  No sign of PTA, low suspicion for retropharyngeal abscess.  Do not need CT soft tissue at this point.  Patient has been given dental resources as well as antibiotics and pain medication.  We discussed proper use of Vicodin, and she voiced understanding.  She will follow-up with a dentist as soon as possible.  Final Clinical Impression(s) / ED Diagnoses Final diagnoses:  Pain, dental    Rx / DC Orders Results and diagnoses were explained to the patient. Return precautions discussed in full. Patient had no additional questions and expressed complete understanding.     44 07/21/21 2227    2228, MD 07/21/21 [id number]

## 2021-07-21 NOTE — Discharge Instructions (Addendum)
I have sent pain medication to the pharmacy.  Please note that the hydrocodone is a controlled substance, do not drive on this and keep it out of the reach of others. Only use it for severe pain. Antibiotics for an infection are also at the pharmacy. I have attached resources for dental pain to these papers as well.

## 2021-07-21 NOTE — ED Notes (Signed)
Pt c/o dental pain x 3 days.

## 2021-07-21 NOTE — ED Triage Notes (Signed)
Dental pain x 2 days

## 2021-07-22 ENCOUNTER — Telehealth: Payer: Self-pay | Admitting: Internal Medicine

## 2021-07-22 NOTE — Telephone Encounter (Signed)
Copied from CRM [id number]. Topic: Referral - Status >> Jul 17, 2021  4:20 PM Marylen Ponto wrote: Reason for CRM: Pt reports that she has not heard from anyone in regards to the referral for MRI.

## 2021-07-22 NOTE — Telephone Encounter (Signed)
Pt states she has not heard back from Neurology to schedule an appt. Looked in referral note and per referral it will need to be forward to [facility]. Arna Medici would you be able to get pt an appt

## 2021-07-29 ENCOUNTER — Other Ambulatory Visit: Payer: Self-pay

## 2021-08-03 ENCOUNTER — Other Ambulatory Visit: Payer: Self-pay

## 2021-08-04 ENCOUNTER — Other Ambulatory Visit: Payer: Self-pay

## 2021-08-10 ENCOUNTER — Other Ambulatory Visit: Payer: Self-pay

## 2021-08-17 ENCOUNTER — Other Ambulatory Visit: Payer: Self-pay

## 2021-08-21 ENCOUNTER — Other Ambulatory Visit: Payer: Self-pay

## 2021-08-24 ENCOUNTER — Ambulatory Visit: Payer: Self-pay | Admitting: Internal Medicine

## 2021-08-24 ENCOUNTER — Other Ambulatory Visit: Payer: Self-pay

## 2021-09-10 ENCOUNTER — Other Ambulatory Visit: Payer: Self-pay

## 2021-09-10 ENCOUNTER — Ambulatory Visit: Payer: Self-pay

## 2021-09-10 ENCOUNTER — Ambulatory Visit: Payer: Self-pay | Admitting: Internal Medicine

## 2021-09-10 NOTE — Telephone Encounter (Signed)
Chief Complaint: COVID Symptoms: body ache, headache, congestion, cough, SOB at times Frequency: since Monday Pertinent Negatives: Patient denies fever Disposition: _0 ED /_1 Urgent Care (no appt availability in office) / _2 Appointment(In office/virtual)/ _3  [facility]/ _4 Home Care/ _5 Refused Recommended Disposition /_6 [place] [facility]/ _7  Follow-up with PCP Additional Notes: Advised pt no virtual appt until 09/15/21, she states she tested positive today. Explained she can go to UC or call [facility] and see if they taking walk ins or call her pharmacy and see if they could prescribe antivirals. Pt states she is still working and unable to go to walk in clinic today would call pharmacy and call back if unable to get medication. Pt called back and stated [facility] told her to contact PCP for rx. Agent scheduled pt for first available VV on 09/15/21.    Summary: Covid positive, seeking Rx   Pt called to report that she is covid positive, she has cough, congestion, sweats, all flu symptoms she says. She is seeking Covid medication   Best contact: [place], [address]  [place] [address]  Phone: [phone] Fax: [phone]      Reason for Disposition  [1] COVID-19 diagnosed by positive lab test (e.g., PCR, rapid self-test kit) AND [2] mild symptoms (e.g., cough, fever, others) AND [8] no complications or SOB  Answer Assessment - Initial Assessment Questions 1. COVID-19 DIAGNOSIS: "Who made your COVID-19 diagnosis?" "Was it confirmed by a positive lab test or self-test?" If not diagnosed by a doctor (or NP/PA), ask "Are there lots of cases (community spread) where you live?" Note: See public health department website, if unsure.     [facility] tested today 3. ONSET: "When did the COVID-19 symptoms start?"      Monday 5. COUGH: "Do you have a cough?" If  Yes, ask: "How bad is the cough?"       yes 6. FEVER: "Do you have a fever?" If Yes, ask: "What is your temperature, how was it measured, and when did it start?"     yes 7. RESPIRATORY STATUS: "Describe your breathing?" (e.g., shortness of breath, wheezing, unable to speak)      SOB at times 8. BETTER-SAME-WORSE: "Are you getting better, staying the same or getting worse compared to yesterday?"  If getting worse, ask, "In what way?"     worse 9. HIGH RISK DISEASE: "Do you have any chronic medical problems?" (e.g., asthma, heart or lung disease, weak immune system, obesity, etc.)     No 10. VACCINE: "Have you had the COVID-19 vaccine?" If Yes, ask: "Which one, how many shots, when did you get it?"       yes 11. BOOSTER: "Have you received your COVID-19 booster?" If Yes, ask: "Which one and when did you get it?"       no 13. OTHER SYMPTOMS: "Do you have any other symptoms?"  (e.g., chills, fatigue, headache, loss of smell or taste, muscle pain, sore throat)       Headache body aches, congestion, decreased appetite  Protocols used: Coronavirus (COVID-19) Diagnosed or Suspected-A-AH

## 2021-09-15 ENCOUNTER — Ambulatory Visit: Payer: Self-pay | Attending: Nurse Practitioner | Admitting: Nurse Practitioner

## 2021-09-15 ENCOUNTER — Encounter: Payer: Self-pay | Admitting: Nurse Practitioner

## 2021-09-15 ENCOUNTER — Other Ambulatory Visit: Payer: Self-pay

## 2021-09-15 DIAGNOSIS — U071 COVID-19: Secondary | ICD-10-CM

## 2021-09-15 NOTE — Progress Notes (Signed)
Virtual Visit via Telephone Note Due to national recommendations of social distancing due to COVID 19, telehealth visit is felt to be most appropriate for this patient at this time.  I discussed the limitations, risks, security and privacy concerns of performing an evaluation and management service by telephone and the availability of in person appointments. I also discussed with the patient that there may be a patient responsible charge related to this service. The patient expressed understanding and agreed to proceed.    I connected with Kathryn Barrett on 09/15/21  at  10:30 AM EST  EDT by telephone and verified that I am speaking with the correct person using two identifiers.  Location of Patient: Private Residence   Location of Provider: [facility]    Persons participating in Telemedicine visit: Bertram Denver FNP-BC Lyndy Mendenhall    History of Present Illness: Telemedicine visit for: COVID Positive  States she tested positive for COVID on 09-10-2021. She was not able to be scheduled for virtual visit until today 09-15-2021 which she is now out of the window for antiviral as symptoms onset was 09-07-2021 and  COVID test positive on 09-10-2021. States she is feeling a little better today and only with mild symptoms of cough.  Denies fever, chest pain, shortness of breath.     Past Medical History:  Diagnosis Date   Hypertension     History reviewed. No pertinent surgical history.  Family History  Problem Relation Age of Onset   Breast cancer Mother        late 26's   Diabetes Maternal Grandmother    Diabetes Maternal Grandfather    Diabetes Paternal Grandmother    Diabetes Paternal Grandfather     Social History   Socioeconomic History   Marital status: Single    Spouse name: Not on file   Number of children: Not on file   Years of education: Not on file   Highest education level: Not on file  Occupational History   Not on file   Tobacco Use   Smoking status: Never   Smokeless tobacco: Never  Substance and Sexual Activity   Alcohol use: No    Alcohol/week: 0.0 standard drinks   Drug use: No   Sexual activity: Yes    Partners: Male    Birth control/protection: None  Other Topics Concern   Not on file  Social History Narrative   Not on file   Social Determinants of Health   Financial Resource Strain: Not on file  Food Insecurity: Not on file  Transportation Needs: Not on file  Physical Activity: Not on file  Stress: Not on file  Social Connections: Not on file     Observations/Objective: Awake, alert and oriented x 3   Review of Systems  Constitutional:  Negative for fever, malaise/fatigue and weight loss.  HENT: Negative.  Negative for nosebleeds.   Eyes: Negative.  Negative for blurred vision, double vision and photophobia.  Respiratory:  Positive for cough. Negative for shortness of breath.   Cardiovascular: Negative.  Negative for chest pain, palpitations and leg swelling.  Gastrointestinal: Negative.  Negative for heartburn, nausea and vomiting.  Musculoskeletal: Negative.  Negative for myalgias.  Neurological: Negative.  Negative for dizziness, focal weakness, seizures and headaches.  Psychiatric/Behavioral: Negative.  Negative for suicidal ideas.    Assessment and Plan: Diagnoses and all orders for this visit:  Positive self-administered antigen test for COVID-19 Continue coricidin HBP    Follow Up Instructions Return if symptoms worsen  or fail to improve.     I discussed the assessment and treatment plan with the patient. The patient was provided an opportunity to ask questions and all were answered. The patient agreed with the plan and demonstrated an understanding of the instructions.   The patient was advised to call back or seek an in-person evaluation if the symptoms worsen or if the condition fails to improve as anticipated.  I provided 6 minutes of non-face-to-face time  during this encounter including median intraservice time, reviewing previous notes, labs, imaging, medications and explaining diagnosis and management.  Claiborne Rigg, FNP-BC

## 2021-10-09 ENCOUNTER — Other Ambulatory Visit: Payer: Self-pay

## 2021-11-06 ENCOUNTER — Other Ambulatory Visit: Payer: Self-pay

## 2021-12-14 ENCOUNTER — Other Ambulatory Visit: Payer: Self-pay

## 2021-12-17 ENCOUNTER — Ambulatory Visit: Payer: Self-pay

## 2021-12-17 NOTE — Telephone Encounter (Signed)
    Chief Complaint: burning thigh pain Symptoms: numbness, chest pain that comes and exertion make CP worse and rest makes CP better for past 2 weeks chest pain began after taking Ozempic Frequency: . 2 months Pertinent Negatives: Patient denies back pain, SOB, radiating pain, breathing difficulty, swelling or rash, fever Disposition: [] ED /[] Urgent Care (no appt availability in office) / [x] Appointment(In office/virtual)/ []  [facility]/ [] Home Care/ [] Refused Recommended Disposition /[] [place] [facility]/ []  Follow-up with PCP Additional Notes: please review sx.              Reason for Disposition  Leg pain or muscle cramp is a chronic symptom (recurrent or ongoing AND present > 4 weeks)  Answer Assessment - Initial Assessment Questions 1. ONSET: "When did the pain start?"      At night  2. LOCATION: "Where is the pain located?"      Left thight 3. PAIN: "How bad is the pain?"    (Scale 1-10; or mild, moderate, severe)   -  MILD (1-3): doesn't interfere with normal activities    -  MODERATE (4-7): interferes with normal activities (e.g., work or school) or awakens from sleep, limping    -  SEVERE (8-10): excruciating pain, unable to do any normal activities, unable to walk     Burning thigh 4. WORK OR EXERCISE: "Has there been any recent work or exercise that involved this part of the body?"      Worse pain (burning) when lie down versus standinand the burning as much 5. CAUSE: "What do you think is causing the leg pain?"     unsure 6. OTHER SYMPTOMS: "Do you have any other symptoms?" (e.g., chest pain, back pain, breathing difficulty, swelling, rash, fever, numbness, weakness)     Numbness, chest pain mid chest that comes and goes  "tight" exertion makes CP worse resting makes CP betterpast 2 weeks ozempic  Protocols used: Leg Pain-A-AH

## 2021-12-22 NOTE — Telephone Encounter (Signed)
Called pt made aware °

## 2021-12-25 ENCOUNTER — Ambulatory Visit: Payer: Self-pay | Attending: Family Medicine | Admitting: Family Medicine

## 2021-12-25 ENCOUNTER — Other Ambulatory Visit: Payer: Self-pay

## 2021-12-25 ENCOUNTER — Encounter: Payer: Self-pay | Admitting: Family Medicine

## 2021-12-25 VITALS — BP 110/76 | HR 95 | Ht 67.0 in | Wt 253.4 lb

## 2021-12-25 DIAGNOSIS — I10 Essential (primary) hypertension: Secondary | ICD-10-CM

## 2021-12-25 DIAGNOSIS — R202 Paresthesia of skin: Secondary | ICD-10-CM

## 2021-12-25 DIAGNOSIS — Z131 Encounter for screening for diabetes mellitus: Secondary | ICD-10-CM

## 2021-12-25 LAB — POCT GLYCOSYLATED HEMOGLOBIN (HGB A1C): Hemoglobin A1C: 5.4 % (ref 4.0–5.6)

## 2021-12-25 MED ORDER — HYDROCHLOROTHIAZIDE 25 MG PO TABS
ORAL_TABLET | Freq: Every day | ORAL | 1 refills | Status: DC
  Filled 2021-12-25: qty 90, 90d supply, fill #0
  Filled 2022-05-28: qty 90, 90d supply, fill #1

## 2021-12-25 MED ORDER — GABAPENTIN 300 MG PO CAPS
300.0000 mg | ORAL_CAPSULE | Freq: Every day | ORAL | 3 refills | Status: DC
  Filled 2021-12-25: qty 30, 30d supply, fill #0

## 2021-12-25 NOTE — Patient Instructions (Signed)
Paresthesia Paresthesia is a burning or prickling feeling. This feeling can happen in any part of the body. It often happens in the hands, arms, legs, or feet. Usually, it is not painful. In most cases, the feeling goes away in a short time and is not a sign of a serious problem. If you have paresthesia that lasts a long time, you need to see your doctor. Follow these instructions at home: Nutrition Eat a healthy diet. This includes: Eating foods that are high in fiber. These include beans, whole grains, and fresh fruits and vegetables. Limiting foods that are high in fat and sugar. These include fried or sweet foods.  Alcohol use  Do not drink alcohol if: Your doctor tells you not to drink. You are pregnant, may be pregnant, or are planning to become pregnant. If you drink alcohol: Limit how much you have to: 0-1 drink a day for women. 0-2 drinks a day for men. Know how much alcohol is in your drink. In the [place] one drink equals one 12 oz bottle of beer (355 mL), one 5 oz glass of wine (148 mL), or one 1 oz glass of hard liquor (44 mL). General instructions Take over-the-counter and prescription medicines only as told by your doctor. Do not smoke or use any products that contain nicotine or tobacco. If you need help quitting, ask your doctor. If you have diabetes, work with your doctor to make sure your blood sugar stays in a healthy range. If your feet feel numb: Check for redness, warmth, and swelling every day. Wear padded socks and comfortable shoes. These help protect your feet. Keep all follow-up visits. Contact a doctor if: You have paresthesia that gets worse or does not go away. You lose feeling (have numbness) after an injury. Your burning or prickling feeling gets worse when you walk. You have pain or cramps. You feel dizzy or you faint. You have a rash. Get help right away if: You feel weak or have new weakness in an arm or leg. You have trouble walking or  moving. You have problems speaking, understanding, or seeing. You feel confused. You cannot control when you pee (urinate) or poop (have a bowel movement). These symptoms may be an emergency. Get help right away. Call 911. Do not wait to see if the symptoms will go away. Do not drive yourself to the hospital. Summary Paresthesia is a burning or prickling feeling. It often happens in the hands, arms, legs, or feet. In most cases, the feeling goes away in a short time and is not a sign of a serious problem. If you have paresthesia that lasts a long time, you need to be seen by your doctor. This information is not intended to replace advice given to you by your health care provider. Make sure you discuss any questions you have with your health care provider. Document Revised: 03/23/2021 Document Reviewed: 03/23/2021 Elsevier Patient Education  2023 [employer]  

## 2021-12-25 NOTE — Progress Notes (Signed)
Subjective:  Patient ID: Kathryn Barrett, female    DOB: 1971/01/19  Age: 51 y.o. MRN: [id number]  CC: Leg Pain   HPI Kathryn Barrett is a 51 y.o. year old female patient of Dr. Wynetta Emery with a history of hypertension who presents today with left lateral thigh paresthesia.  Interval History: She complains of burning in thighs on the L side of her thigh, feels lumps as well and informs me she has had it for 3 months. Symptoms are now persistent as opposed to being intermittent.  7 months ago she was seen by the physician assistant for numbness in her right foot which was spread to other parts of her body. Seen by neurology in 2019 and brain MRI with nonspecific findings which did not meet criteria for demyelinating disease.  Per notes diagnostic spinal tap to confirm possible MS was recommended but patient was reluctant at that time.  She was advised to follow-up in 6 months. Denies episodes of visual loss, gait abnormalities, weakness of body parts. She does not have a known history of diabetes mellitus Past Medical History:  Diagnosis Date   Hypertension     No past surgical history on file.  Family History  Problem Relation Age of Onset   Breast cancer Mother        late 78's   Diabetes Maternal Grandmother    Diabetes Maternal Grandfather    Diabetes Paternal Grandmother    Diabetes Paternal Grandfather     Social History   Socioeconomic History   Marital status: Single    Spouse name: Not on file   Number of children: Not on file   Years of education: Not on file   Highest education level: Not on file  Occupational History   Not on file  Tobacco Use   Smoking status: Never   Smokeless tobacco: Never  Substance and Sexual Activity   Alcohol use: No    Alcohol/week: 0.0 standard drinks   Drug use: No   Sexual activity: Yes    Partners: Male    Birth control/protection: None  Other Topics Concern   Not on file  Social History Narrative   Not on file    Social Determinants of Health   Financial Resource Strain: Not on file  Food Insecurity: Not on file  Transportation Needs: Not on file  Physical Activity: Not on file  Stress: Not on file  Social Connections: Not on file    Allergies  Allergen Reactions   Lisinopril Swelling    Edema in hands and feet   Norvasc [Amlodipine] Swelling    Outpatient Medications Prior to Visit  Medication Sig Dispense Refill   ferrous sulfate 325 (65 FE) MG tablet Take 1 tablet (325 mg total) by mouth 2 (two) times daily with a meal. 120 tablet 3   hydrochlorothiazide (HYDRODIURIL) 25 MG tablet TAKE 1 TABLET (25 MG TOTAL) BY MOUTH DAILY. 90 tablet 3   amoxicillin-clavulanate (AUGMENTIN) 875-125 MG tablet Take 1 tablet by mouth every 12 (twelve) hours. (Patient not taking: Reported on 12/25/2021) 14 tablet 0   hydrOXYzine (ATARAX) 10 MG tablet Take 1 tablet (10 mg total) by mouth 3 (three) times daily as needed. (Patient not taking: Reported on 12/25/2021) 30 tablet 0   meclizine (ANTIVERT) 25 MG tablet Take 1 tablet (25 mg total) by mouth 2 (two) times daily as needed for dizziness. (Patient not taking: Reported on 12/25/2021) 30 tablet 0   polyethylene glycol (MIRALAX) 17 g packet Take 17 g by  mouth daily as needed. (Patient not taking: Reported on 12/25/2021) 30 each 3   Vitamin D, Ergocalciferol, (DRISDOL) 1.25 MG (50000 UNIT) CAPS capsule Take 1 capsule (50,000 Units total) by mouth every 7 (seven) days. (Patient not taking: Reported on 12/25/2021) 4 capsule 2   No facility-administered medications prior to visit.     ROS Review of Systems  Constitutional:  Negative for activity change and appetite change.  HENT:  Negative for sinus pressure and sore throat.   Respiratory:  Negative for chest tightness, shortness of breath and wheezing.   Cardiovascular:  Negative for chest pain and palpitations.  Gastrointestinal:  Negative for abdominal distention, abdominal pain and constipation.  Genitourinary:  Negative.   Musculoskeletal: Negative.   Neurological:  Positive for numbness.  Psychiatric/Behavioral:  Negative for behavioral problems and dysphoric mood.    Objective:  BP 110/76   Pulse 95   Ht 5\' 7"  (1.702 m)   Wt 253 lb 6.4 oz (114.9 kg)   SpO2 100%   BMI 39.69 kg/m      12/25/2021    2:39 PM 07/21/2021    9:19 PM 07/21/2021    9:14 PM  BP/Weight  Systolic BP [id number]   Diastolic BP 76 86   Wt. (Lbs) 253.4  257.06  BMI 39.69 kg/m2  40.26 kg/m2      Physical Exam Constitutional:      Appearance: She is well-developed.  Cardiovascular:     Rate and Rhythm: Normal rate.     Heart sounds: Normal heart sounds. No murmur heard. Pulmonary:     Effort: Pulmonary effort is normal.     Breath sounds: Normal breath sounds. No wheezing or rales.  Chest:     Chest wall: No tenderness.  Abdominal:     General: Bowel sounds are normal. There is no distension.     Palpations: Abdomen is soft. There is no mass.     Tenderness: There is no abdominal tenderness.  Musculoskeletal:        General: Normal range of motion.     Right lower leg: No edema.     Left lower leg: No edema.  Neurological:     Mental Status: She is alert and oriented to person, place, and time.     Sensory: No sensory deficit.     Motor: No weakness.  Psychiatric:        Mood and Affect: Mood normal.       Latest Ref Rng & Units 02/05/2021   12:13 PM 12/05/2019    9:51 AM 01/17/2019   10:55 AM  CMP  Glucose 65 - 99 mg/dL 92   104   89    BUN 6 - 24 mg/dL 11   13   7     Creatinine 0.57 - 1.00 mg/dL 0.92   0.98   0.83    Sodium 134 - 144 mmol/L 141   140   141    Potassium 3.5 - 5.2 mmol/L 4.6   4.1   4.5    Chloride 96 - 106 mmol/L 102   102   104    CO2 20 - 29 mmol/L 23   22   21     Calcium 8.7 - 10.2 mg/dL 9.2   9.5   9.3    Total Protein 6.0 - 8.5 g/dL 7.4    6.9    Total Bilirubin 0.0 - 1.2 mg/dL 0.3    0.4    Alkaline Phos 44 - 121 IU/L  102    82    AST 0 - 40 IU/L 10    15    ALT 0 -  32 IU/L 11    19      Lipid Panel     Component Value Date/Time   CHOL 125 02/05/2021 1213   TRIG 47 02/05/2021 1213   HDL 40 02/05/2021 1213   CHOLHDL 3.1 02/05/2021 1213   CHOLHDL 3.1 11/02/2013 1026   VLDL 10 11/02/2013 1026   LDLCALC 74 02/05/2021 1213    CBC    Component Value Date/Time   WBC 6.1 06/22/2021 1424   WBC 9.9 11/08/2016 0441   RBC 4.60 06/22/2021 1424   RBC 4.97 11/08/2016 0441   HGB 11.6 06/22/2021 1424   HCT 37.1 06/22/2021 1424   PLT 285 06/22/2021 1424   MCV 81 06/22/2021 1424   MCH 25.2 (L) 06/22/2021 1424   MCH 18.3 (L) 11/08/2016 0441   MCHC 31.3 (L) 06/22/2021 1424   MCHC 30.7 11/08/2016 0441   RDW 14.1 06/22/2021 1424   LYMPHSABS 2.3 06/22/2021 1424   MONOABS 0.7 01/31/2015 1215   EOSABS 0.2 06/22/2021 1424   BASOSABS 0.1 06/22/2021 1424    Lab Results  Component Value Date   HGBA1C 5.4 12/25/2021    Assessment & Plan:  1. Paresthesia Symptoms are absent at the moment Symptoms also suspicious for meralgia paresthetica Given her intermittent nature of symptoms and the fact that she did have paresthesia in other body parts last year I will refer her back to neurology as notes had recommended 69-month follow-up from last visit but she never followed up - Vitamin B12 - gabapentin (NEURONTIN) 300 MG capsule; Take 1 capsule (300 mg total) by mouth at bedtime.  Dispense: 30 capsule; Refill: 3 - Ambulatory referral to Neurology  2. Screening for diabetes mellitus Screening is negative - POCT glycosylated hemoglobin (Hb A1C)  3. Essential hypertension Controlled Counseled on blood pressure goal of less than 130/80, low-sodium, DASH diet, medication compliance, 150 minutes of moderate intensity exercise per week. Discussed medication compliance, adverse effects. - Basic Metabolic Panel - hydrochlorothiazide (HYDRODIURIL) 25 MG tablet; TAKE 1 TABLET (25 MG TOTAL) BY MOUTH DAILY.  Dispense: 90 tablet; Refill: 1    Meds ordered this  encounter  Medications   gabapentin (NEURONTIN) 300 MG capsule    Sig: Take 1 capsule (300 mg total) by mouth at bedtime.    Dispense:  30 capsule    Refill:  3   hydrochlorothiazide (HYDRODIURIL) 25 MG tablet    Sig: TAKE 1 TABLET (25 MG TOTAL) BY MOUTH DAILY.    Dispense:  90 tablet    Refill:  1    Follow-up: Return in about 3 months (around 03/27/2022) for Medical conditions with PCP.       Charlott Rakes, MD, FAAFP. [facility] and [place], [place]   12/25/2021, 5:33 PM

## 2021-12-25 NOTE — Progress Notes (Signed)
Burning in left leg for several months.

## 2021-12-26 LAB — BASIC METABOLIC PANEL
BUN/Creatinine Ratio: 10 (ref 9–23)
BUN: 10 mg/dL (ref 6–24)
CO2: 26 mmol/L (ref 20–29)
Calcium: 10.3 mg/dL — ABNORMAL HIGH (ref 8.7–10.2)
Chloride: 100 mmol/L (ref 96–106)
Creatinine, Ser: 1 mg/dL (ref 0.57–1.00)
Glucose: 90 mg/dL (ref 70–99)
Potassium: 3.6 mmol/L (ref 3.5–5.2)
Sodium: 140 mmol/L (ref 134–144)
eGFR: 69 mL/min/{1.73_m2} (ref >59)

## 2021-12-26 LAB — VITAMIN B12: Vitamin B-12: 408 pg/mL (ref 232–1245)

## 2021-12-31 ENCOUNTER — Encounter: Payer: Self-pay | Admitting: Neurology

## 2022-01-13 ENCOUNTER — Ambulatory Visit: Payer: No Typology Code available for payment source | Admitting: Physician Assistant

## 2022-02-04 ENCOUNTER — Other Ambulatory Visit: Payer: Self-pay

## 2022-02-05 ENCOUNTER — Other Ambulatory Visit: Payer: Self-pay

## 2022-02-06 IMAGING — MG MM DIGITAL SCREENING BILAT W/ TOMO AND CAD
8 series · 8 of 24 positions shown · non-contrast
Comparison: Previous exam(s).

CLINICAL DATA: Screening.

EXAM:
DIGITAL SCREENING BILATERAL MAMMOGRAM WITH TOMOSYNTHESIS AND CAD
TECHNIQUE: Bilateral screening digital craniocaudal and mediolateral oblique
mammograms were obtained. Bilateral screening digital breast
tomosynthesis was performed. The images were evaluated with
computer-aided detection.

[L MLO synth-2D]
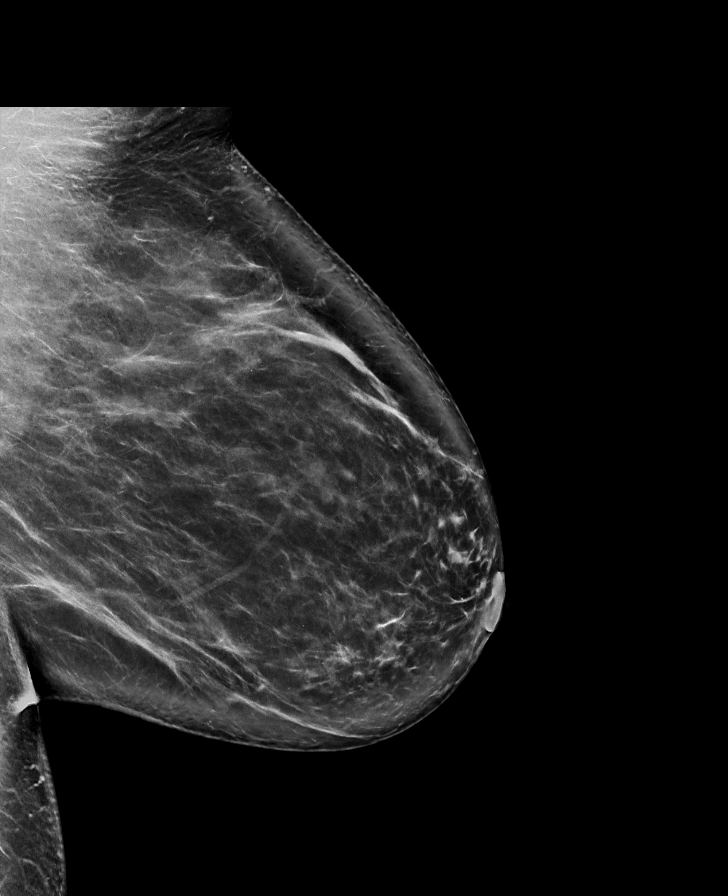

[R MLO synth-2D]
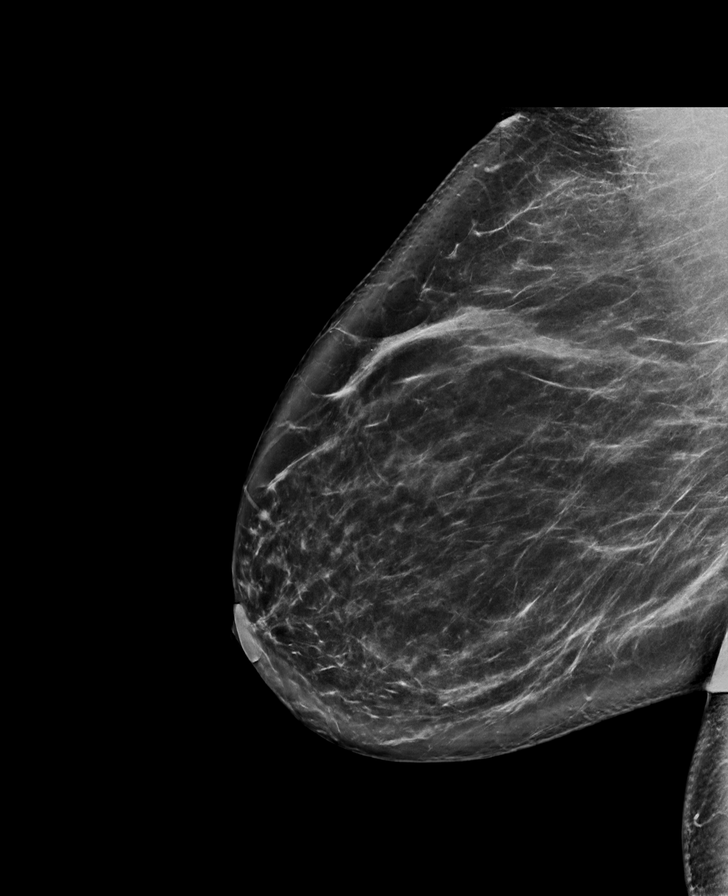

[L CC synth-2D]
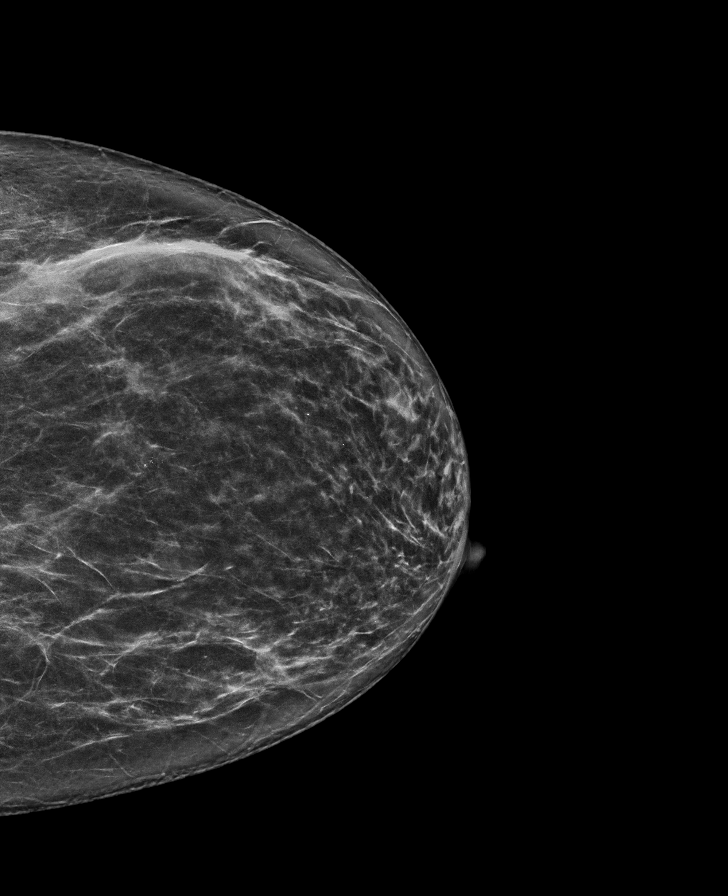

[R CC synth-2D]
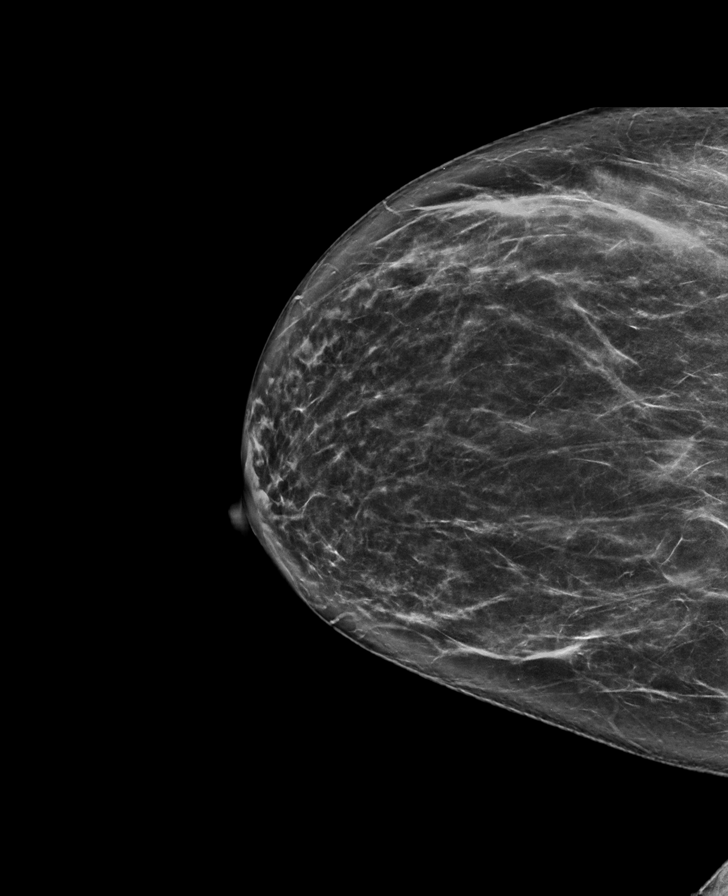

[L MLO tomo · tomo slice 50/99.0]
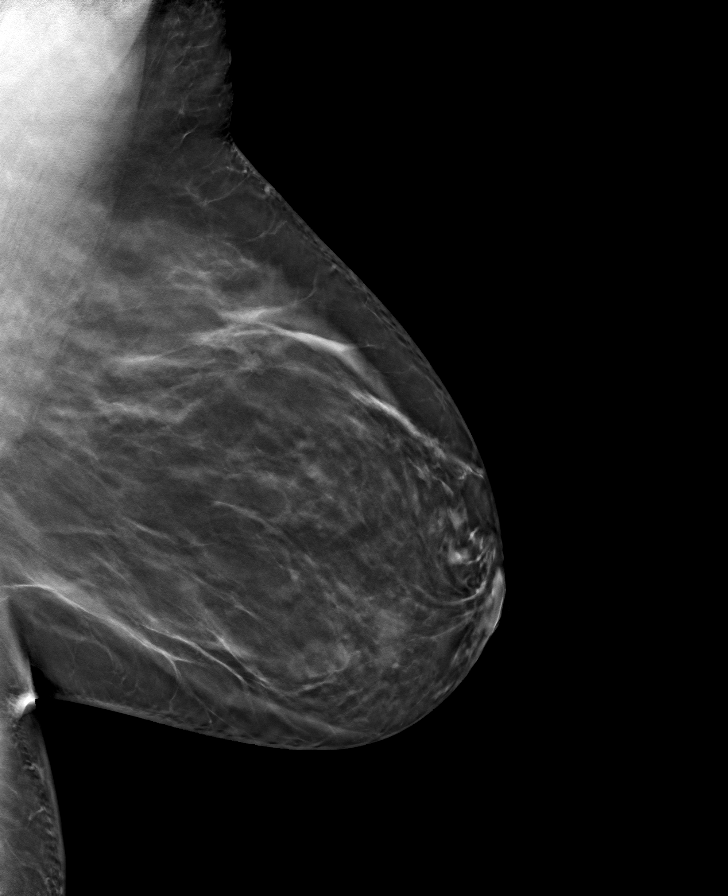

[L CC tomo · tomo slice 39/78.0]
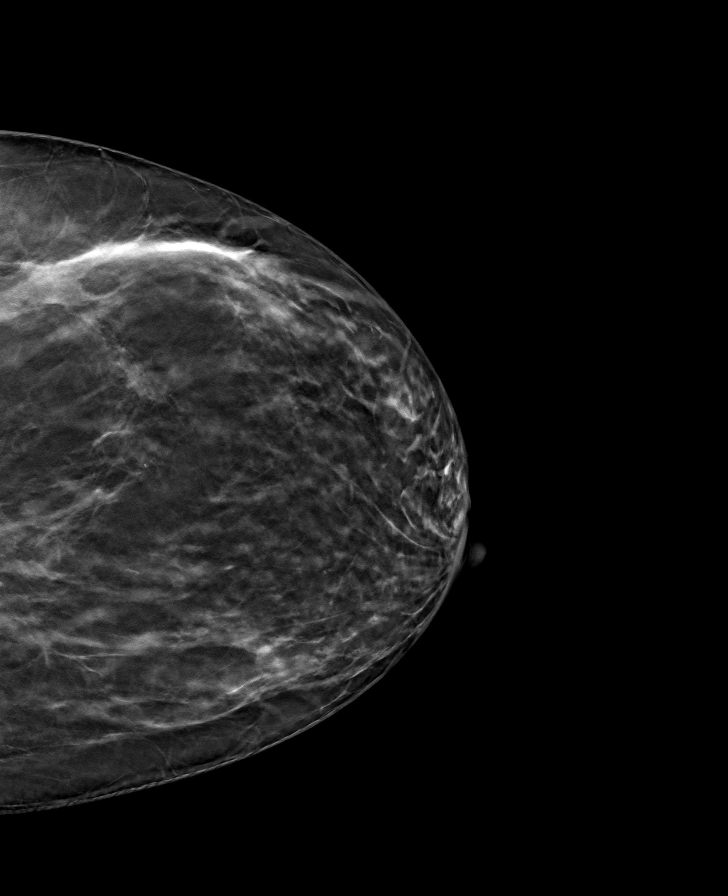

[R CC tomo · tomo slice 40/79.0]
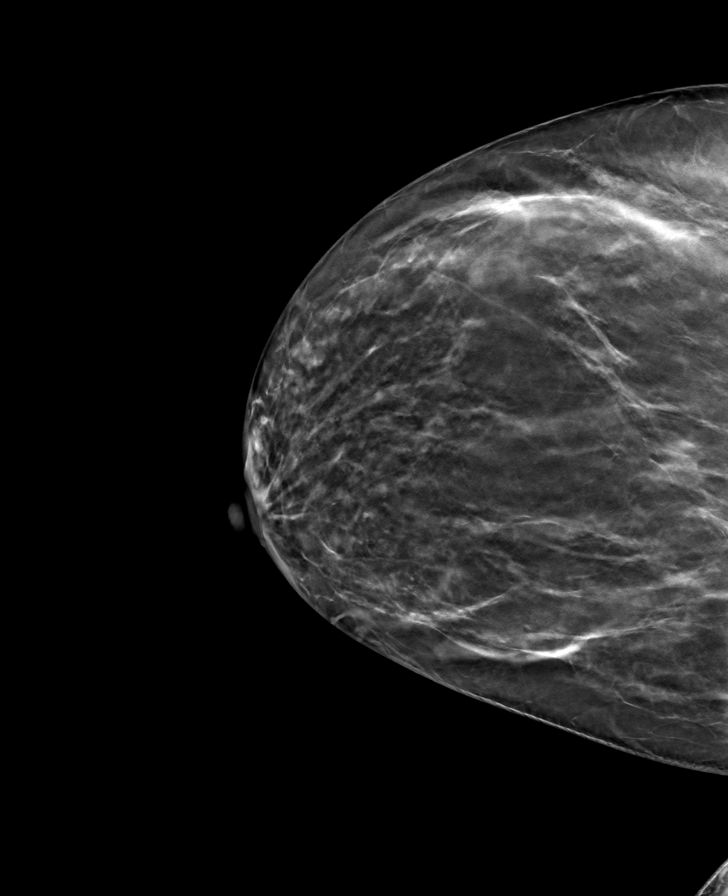

[R MLO tomo · tomo slice 47/94.0]
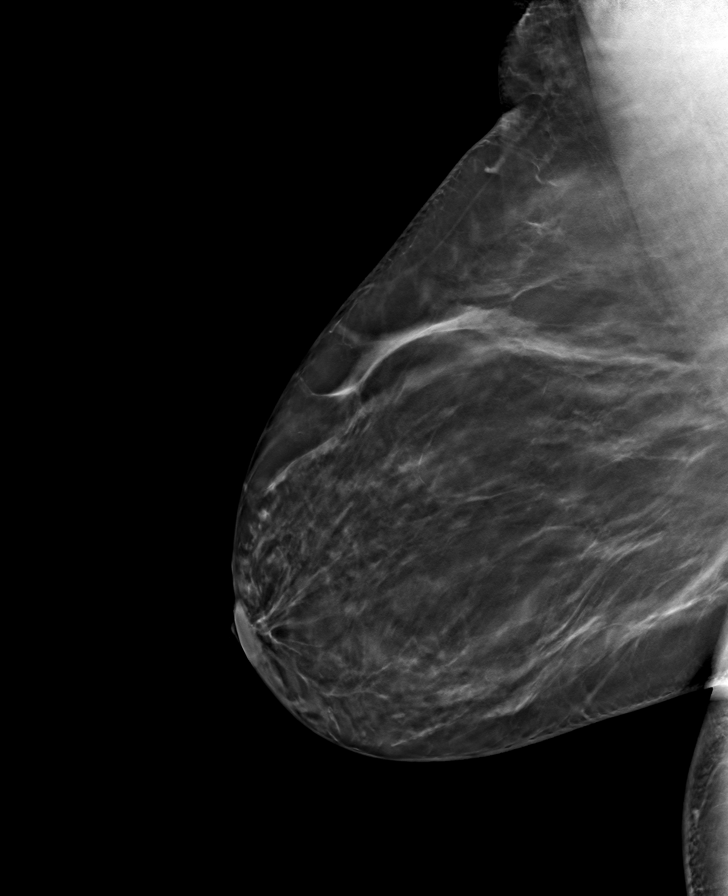

[8 of 24 positions shown; findings below may reference images not displayed]

ACR Breast Density Category b: There are scattered areas of
fibroglandular density.
FINDINGS: There are no findings suspicious for malignancy. The images were
evaluated with computer-aided detection.
IMPRESSION: No mammographic evidence of malignancy. A result letter of this
screening mammogram will be mailed directly to the patient.

RECOMMENDATION:
Screening mammogram in one year. (Code:WJ-I-BG6)

BI-RADS CATEGORY  1: Negative.

## 2022-03-12 ENCOUNTER — Other Ambulatory Visit: Payer: Self-pay | Admitting: Internal Medicine

## 2022-03-12 DIAGNOSIS — Z1231 Encounter for screening mammogram for malignant neoplasm of breast: Secondary | ICD-10-CM

## 2022-03-15 ENCOUNTER — Ambulatory Visit
Admission: RE | Admit: 2022-03-15 | Discharge: 2022-03-15 | Disposition: A | Discharge status: Home / Self Care | Payer: Self-pay | Source: Ambulatory Visit | Attending: Internal Medicine | Admitting: Internal Medicine

## 2022-03-15 DIAGNOSIS — Z1231 Encounter for screening mammogram for malignant neoplasm of breast: Secondary | ICD-10-CM

## 2022-03-31 NOTE — Progress Notes (Deleted)
Initial neurology clinic note  SERVICE DATE: 03/31/22 SERVICE TIME: 11:00  Reason for Evaluation: Consultation requested by Charlott Rakes, MD for an opinion regarding burning of lateral thighs. My final recommendations will be communicated back to the requesting physician by way of shared medical record or letter to requesting physician via [place] mail.  HPI: This is Ms. Kathryn Barrett, a 51 y.o. ***-handed female with a medical history of HTN*** who presents to neurology clinic with the chief complaint of ***. The patient is accompanied by ***.  ***  Patient was previously seen at [facility] (last on 02/06/18) for leg numbness. Per documentation, patient had sudden onset of left foot numbness on 02/29/16 that then spread over 1 minute to left leg below the knee that lasted an hour and a half and gradually went away. She had another episode a week later that lasted a few minutes and resolved. Symptoms then resolved for about 6 months before returning with intermittent numbness in the lateral aspect of the right foot and right forearm. She had MRI of brain, MRA of brain, and spinal cord (***) that showed non-specific periventricular white matter changes and mid basilar artery filling defect that was felt to be artifact. Lab work with B12, TSH, RPR, HIV, ANA, and ESR were normal. Patient did not meet clinical criteria for demyelinating disease at that time.   The patient has not*** had similar episodes of symptoms in the past. *** Muscle bulk loss? *** Muscle pain? *** Cramps/Twitching? *** Suggestion of myotonia/difficulty relaxing after contraction? *** Fatigable weakness?*** Does strength improve after brief exercise?*** Able to brush hair/teeth without difficulty? *** Able to button shirts/use zips? *** Clumsiness/dropping grasped objects?*** Can you arise from squatted position easily? *** Able to get out of chair without using arms? *** Able to walk up steps easily? *** Use an  assistive device to walk? *** Significant imbalance with walking? *** Falls?*** Any change in urine color, especially after exertion/physical activity? ***  The patient denies*** symptoms suggestive of oculobulbar weakness including diplopia, ptosis, dysphagia, poor saliva control, dysarthria/dysphonia, impaired mastication, facial weakness/droop.  There are no*** neuromuscular respiratory weakness symptoms, particularly orthopnea>dyspnea. Pseudobulbar affect is absent***. The patient does not*** report symptoms referable to autonomic dysfunction including impaired sweating, heat or cold intolerance, excessive mucosal dryness, gastroparetic early satiety, postprandial abdominal bloating, constipation, bowel or bladder dyscontrol, erectile dysfunction*** or syncope/presyncope/orthostatic intolerance.  There are no*** complaints relating to other symptoms of small fiber modalities including paresthesia/pain.  The patient has not *** noticed any recent skin rashes nor does he*** report any constitutional symptoms like fever, night sweats, anorexia or unintentional weight loss.  EtOH use: ***  Restrictive diet? *** Family history of neuropathy/myopathy/NM disease?***  Previous labs, electrodiagnostics, and neuroimaging are summarized below, but pertinent findings include***  Any biopsy done? *** Current medications being tried for the patient's symptoms include ***  Prior medications that have been tried: ***   MEDICATIONS:  Outpatient Encounter Medications as of 04/06/2022  Medication Sig   amoxicillin-clavulanate (AUGMENTIN) 875-125 MG tablet Take 1 tablet by mouth every 12 (twelve) hours. (Patient not taking: Reported on 12/25/2021)   ferrous sulfate 325 (65 FE) MG tablet Take 1 tablet (325 mg total) by mouth 2 (two) times daily with a meal.   gabapentin (NEURONTIN) 300 MG capsule Take 1 capsule (300 mg total) by mouth at bedtime.   hydrochlorothiazide (HYDRODIURIL) 25 MG tablet TAKE 1 TABLET  (25 MG TOTAL) BY MOUTH DAILY.   hydrOXYzine (ATARAX) 10 MG tablet Take  1 tablet (10 mg total) by mouth 3 (three) times daily as needed. (Patient not taking: Reported on 12/25/2021)   meclizine (ANTIVERT) 25 MG tablet Take 1 tablet (25 mg total) by mouth 2 (two) times daily as needed for dizziness. (Patient not taking: Reported on 12/25/2021)   polyethylene glycol (MIRALAX) 17 g packet Take 17 g by mouth daily as needed. (Patient not taking: Reported on 12/25/2021)   Vitamin D, Ergocalciferol, (DRISDOL) 1.25 MG (50000 UNIT) CAPS capsule Take 1 capsule (50,000 Units total) by mouth every 7 (seven) days. (Patient not taking: Reported on 12/25/2021)   No facility-administered encounter medications on file as of 04/06/2022.    PAST MEDICAL HISTORY: Past Medical History:  Diagnosis Date   Hypertension     PAST SURGICAL HISTORY: No past surgical history on file.  ALLERGIES: Allergies  Allergen Reactions   Lisinopril Swelling    Edema in hands and feet   Norvasc [Amlodipine] Swelling    FAMILY HISTORY: Family History  Problem Relation Age of Onset   Breast cancer Mother        late 63's   Diabetes Maternal Grandmother    Diabetes Maternal Grandfather    Diabetes Paternal Grandmother    Diabetes Paternal Grandfather     SOCIAL HISTORY: Social History   Tobacco Use   Smoking status: Never   Smokeless tobacco: Never  Substance Use Topics   Alcohol use: No    Alcohol/week: 0.0 standard drinks of alcohol   Drug use: No   Social History   Social History Narrative   Not on file     OBJECTIVE: REVIEW OF SYSTEMS: ***Negative except as noted in HPI  PHYSICAL EXAM: There were no vitals taken for this visit.  General:*** General appearance: Awake and alert. No distress. Cooperative with exam.  Skin: No obvious rash or jaundice. HEENT: Atraumatic. Anicteric. Lungs: Non-labored breathing on room air  Heart: Regular Abdomen: Soft, non tender. Extremities: No edema. No obvious  deformity.  Musculoskeletal: No obvious joint swelling. Psych: Affect appropriate.  Neurological: Mental Status: Alert. Speech fluent. No pseudobulbar affect Cranial Nerves: CNII: No RAPD. Visual fields intact. CNIII, IV, VI: PERRL. No nystagmus. EOMI. CN V: Facial sensation intact bilaterally to fine touch. [place] clench strong. Jaw jerk***. CN VII: Facial muscles symmetric and strong. No ptosis at rest or after sustained upgaze***. CN VIII: Hears finger rub well bilaterally. CN IX: No hypophonia. CN X: Palate elevates symmetrically. CN XI: Full strength shoulder shrug bilaterally. CN XII: Tongue protrusion full and midline. No atrophy or fasciculations. No significant dysarthria*** Motor: Tone is ***. *** fasciculations in *** extremities. *** atrophy. No grip or percussive myotonia.  Individual muscle group testing (MRC grade out of 5):  Movement     Neck flexion ***    Neck extension ***     Right Left   Shoulder abduction *** ***   Shoulder adduction *** ***   Shoulder ext rotation *** ***   Shoulder int rotation *** ***   Elbow flexion *** ***   Elbow extension *** ***   Wrist extension *** ***   Wrist flexion *** ***   Finger abduction - FDI *** ***   Finger abduction - ADM *** ***   Finger extension *** ***   Finger distal flexion - 2/3 *** ***   Finger distal flexion - 4/5 *** ***   Thumb flexion - FPL *** ***   Thumb abduction - APB *** ***    Hip flexion *** ***  Hip extension *** ***   Hip adduction *** ***   Hip abduction *** ***   Knee extension *** ***   Knee flexion *** ***   Dorsiflexion *** ***   Plantarflexion *** ***   Inversion *** ***   Eversion *** ***   Great toe extension *** ***   Great toe flexion *** ***     Reflexes:  Right Left   Bicep *** ***   Tricep *** ***   BrRad *** ***   Knee *** ***   Ankle *** ***    Pathological Reflexes: Babinski: *** response bilaterally*** Hoffman: *** Troemner: *** Pectoral:  *** Palmomental: *** Facial: *** Midline tap: *** Sensation: Pinprick: *** Vibration: *** Temperature: *** Proprioception: *** Coordination: Intact finger-to- nose-finger bilaterally. Romberg negative.*** Gait: Able to rise from chair with arms crossed unassisted. Normal, narrow-based gait. Able to tandem walk. Able to walk on toes and heels.***  Lab and Test Review: Internal labs: Normal or unremarkable: BMP, TSH, CBC B12 (12/25/21): 408 HbA1c (12/25/21): 5.4 (6.0 eight years prior) Vit D (06/22/21): 12.4 Iron: 28, TIBC: 376, Sat: 7% (low), ferritin 13 (low) ***  External labs: ***  MRI cervical spine wo contrast (12/13/17): FINDINGS:  On sagittal views the vertebral bodies have normal height and alignment.  Minimal disc bulging at C4-5, C5-6, C6-7, C7-T1. The spinal cord is normal in size and appearance. The posterior fossa, pituitary gland and paraspinal soft tissues are unremarkable.     On axial views there is no spinal stenosis or foraminal narrowing. Limited views of the soft tissues of the head and neck are unremarkable.     IMPRESSION:  Unremarkable MRI cervical spine (without). No intrinsic or compressive spinal cord lesions. No spinal stenosis or foraminal narrowing.   MRI thoracic spine wo contrast (12/13/17): FINDINGS:  On sagittal views the vertebral bodies have normal height and alignment.  Vertebral body hemangioma at T6 level. The spinal cord is normal in size and appearance. The paraspinal soft tissues are unremarkable.     On axial views there is no spinal stenosis or foraminal narrowing.  Limited views of the aorta, kidneys, liver, lungs and paraspinal muscles are unremarkable.   IMPRESSION:  Unremarkable MRI thoracic spine (without).  MRI brain wo contrast (12/13/17): FINDINGS:  No abnormal lesions are seen on diffusion-weighted views to suggest acute ischemia. The cortical sulci, fissures and cisterns are normal in size and appearance. Lateral, third and  fourth ventricle are normal in size and appearance. No extra-axial fluid collections are seen. No evidence of mass effect or midline shift.  Few scattered periventricular, subcortical and juxtacortical foci of non-specific T2 hyperintensities.    On sagittal views the posterior fossa, pituitary gland and corpus callosum are notable for enlarged partially empty sella. No evidence of intracranial hemorrhage on gradient echo views. The orbits and their contents, paranasal sinuses and calvarium are unremarkable.  Intracranial flow voids are present.   IMPRESSION:  MRI brain (without) demonstrating: - Few scattered periventricular, subcortical and juxtacortical foci of non-specific T2 hyperintensities. These findings are non-specific and considerations include autoimmune, inflammatory, post-infectious, microvascular ischemic or migraine associated etiologies.  - No acute findings.  MRI brain with contrast (02/22/18): FINDINGS:  The cortical sulci, fissures and cisterns are normal in size and appearance. Lateral, third and fourth ventricle are normal in size and appearance. No extra-axial fluid collections are seen. No evidence of mass effect or midline shift.  No abnormal lesions are seen on post contrast views.   The orbits and  their contents, paranasal sinuses and calvarium are unremarkable.  IMPRESSION:   MRI brain (with) demonstrating: - No abnormal enhancing lesions seen.   ***  ASSESSMENT: Hetvi Shawhan is a 51 y.o. female who presents for evaluation of ***. *** has a relevant medical history of ***. *** neurological examination is pertinent for ***. Available diagnostic data is significant for ***. This constellation of symptoms and objective data would most likely localize to ***. ***  PLAN: -Blood work: *** ***  -Return to clinic ***  The impression above as well as the plan as outlined below were extensively discussed with the patient (in the company of ***) who voiced  understanding. All questions were answered to their satisfaction.  The patient was counseled on pertinent fall precautions per the printed material provided today ***, and as noted under the "Patient Instructions" section below.  When available, results of the above investigations and possible further recommendations will be communicated to the patient via telephone/MyChart. Patient to call office if not contacted after expected testing turnaround time.   Total time spent reviewing records, interview, history/exam, documentation, and coordination of care on day of encounter:  *** min   Thank you for allowing me to participate in patient's care.  If I can answer any additional questions, I would be pleased to do so.  Kai Levins, MD   CC: Ladell Pier, MD [address] [place] [address]  CC: Referring provider: Charlott Rakes, MD [place],  [place] [address]

## 2022-04-02 ENCOUNTER — Ambulatory Visit: Payer: No Typology Code available for payment source

## 2022-04-06 ENCOUNTER — Ambulatory Visit: Payer: Self-pay | Admitting: Neurology

## 2022-04-26 ENCOUNTER — Ambulatory Visit: Payer: Self-pay | Admitting: Internal Medicine

## 2022-05-28 ENCOUNTER — Other Ambulatory Visit: Payer: Self-pay | Admitting: Internal Medicine

## 2022-05-28 ENCOUNTER — Other Ambulatory Visit: Payer: Self-pay

## 2022-05-28 DIAGNOSIS — N920 Excessive and frequent menstruation with regular cycle: Secondary | ICD-10-CM

## 2022-05-28 DIAGNOSIS — D500 Iron deficiency anemia secondary to blood loss (chronic): Secondary | ICD-10-CM

## 2022-05-28 MED ORDER — FERROUS SULFATE 325 (65 FE) MG PO TABS
325.0000 mg | ORAL_TABLET | Freq: Two times a day (BID) | ORAL | 2 refills | Status: AC
  Filled 2022-05-28: qty 60, 30d supply, fill #0
  Filled 2022-07-12: qty 60, 30d supply, fill #1
  Filled 2022-09-28 – 2022-09-29 (×2): qty 60, 30d supply, fill #2

## 2022-05-28 NOTE — Telephone Encounter (Signed)
Requested Prescriptions  Pending Prescriptions Disp Refills   ferrous sulfate (FEROSUL) 325 (65 FE) MG tablet 120 tablet 2    Sig: Take 1 tablet (325 mg total) by mouth 2 (two) times daily with a meal.     Endocrinology:  Minerals - Iron Supplementation Failed - 05/28/2022  9:52 AM      Failed - Ferritin in normal range and within 360 days    Ferritin  Date Value Ref Range Status  06/22/2021 13 (L) 15 - 150 ng/mL Final         Passed - HGB in normal range and within 360 days    Hemoglobin  Date Value Ref Range Status  06/22/2021 11.6 11.1 - 15.9 g/dL Final         Passed - HCT in normal range and within 360 days    Hematocrit  Date Value Ref Range Status  06/22/2021 37.1 34.0 - 46.6 % Final         Passed - RBC in normal range and within 360 days    RBC  Date Value Ref Range Status  06/22/2021 4.60 3.77 - 5.28 x10E6/uL Final  11/08/2016 4.97 3.87 - 5.11 MIL/uL Final         Passed - Fe (serum) in normal range and within 360 days    Iron  Date Value Ref Range Status  06/22/2021 28 27 - 159 ug/dL Final   Iron Saturation  Date Value Ref Range Status  06/22/2021 7 (LL) 15 - 55 % Final         Passed - Valid encounter within last 12 months    Recent Outpatient Visits           5 months ago [place], En[place]ong, MD   8 months ago Positive self-administered antigen test for [place] Gildardo Pounds, NP   11 months ago Numbness of right foot   [place], [place], [place]   1 year ago Essential hypertension   [place], Deborah B, MD   2 years ago Essential hypertension   [place], MD       Future Appointments             In 1 month Wynetta Emery, Dalbert Batman, MD [place]

## 2022-06-02 ENCOUNTER — Other Ambulatory Visit: Payer: Self-pay

## 2022-06-22 NOTE — Progress Notes (Unsigned)
Initial neurology clinic note  SERVICE DATE: 06/24/22  Reason for Evaluation: Consultation requested by Marcine Matar, MD for an opinion regarding burning of thigh***. My final recommendations will be communicated back to the requesting physician by way of shared medical record or letter to requesting physician via [place] mail.  HPI: This is Ms. Kathryn Barrett, a 51 y.o. ***-handed female with a medical history of *** who presents to neurology clinic with the chief complaint of ***. The patient is accompanied by ***.  ***  Burning of thighs (both or just left?) since 09/2021. Was intermittent, but no constant Had numbness in right foot at the end of 2022  On gabapentin 300 mg qhs***  Seen by [facility] in 2019 for acute onset of left foot numbness. MS was considered, but MRI brain, cervical spine, and thoracic spine were not definitive for demyelinating disease.  RLS?***iron supplementation?***  Vit D supplementation?***  The patient has not*** had similar episodes of symptoms in the past. ***  Muscle bulk loss? *** Muscle pain? ***  Cramps/Twitching? *** Suggestion of myotonia/difficulty relaxing after contraction? ***  Fatigable weakness?*** Does strength improve after brief exercise?***  Able to brush hair/teeth without difficulty? *** Able to button shirts/use zips? *** Clumsiness/dropping grasped objects?*** Can you arise from squatted position easily? *** Able to get out of chair without using arms? *** Able to walk up steps easily? *** Use an assistive device to walk? *** Significant imbalance with walking? *** Falls?*** Any change in urine color, especially after exertion/physical activity? ***  The patient denies*** symptoms suggestive of oculobulbar weakness including diplopia, ptosis, dysphagia, poor saliva control, dysarthria/dysphonia, impaired mastication, facial weakness/droop.  There are no*** neuromuscular respiratory weakness symptoms, particularly  orthopnea>dyspnea.   Pseudobulbar affect is absent***.  The patient does not*** report symptoms referable to autonomic dysfunction including impaired sweating, heat or cold intolerance, excessive mucosal dryness, gastroparetic early satiety, postprandial abdominal bloating, constipation, bowel or bladder dyscontrol, erectile dysfunction*** or syncope/presyncope/orthostatic intolerance.  There are no*** complaints relating to other symptoms of small fiber modalities including paresthesia/pain.  The patient has not *** noticed any recent skin rashes nor does he*** report any constitutional symptoms like fever, night sweats, anorexia or unintentional weight loss.  EtOH use: ***  Restrictive diet? *** Family history of neuropathy/myopathy/NM disease?***  Previous labs, electrodiagnostics, and neuroimaging are summarized below, but pertinent findings include***  Any biopsy done? *** Current medications being tried for the patient's symptoms include ***  Prior medications that have been tried: ***   MEDICATIONS:  Outpatient Encounter Medications as of 06/24/2022  Medication Sig   amoxicillin-clavulanate (AUGMENTIN) 875-125 MG tablet Take 1 tablet by mouth every 12 (twelve) hours. (Patient not taking: Reported on 12/25/2021)   ferrous sulfate (FEROSUL) 325 (65 FE) MG tablet Take 1 tablet (325 mg total) by mouth 2 (two) times daily with a meal.   gabapentin (NEURONTIN) 300 MG capsule Take 1 capsule (300 mg total) by mouth at bedtime.   hydrochlorothiazide (HYDRODIURIL) 25 MG tablet TAKE 1 TABLET (25 MG TOTAL) BY MOUTH DAILY.   hydrOXYzine (ATARAX) 10 MG tablet Take 1 tablet (10 mg total) by mouth 3 (three) times daily as needed. (Patient not taking: Reported on 12/25/2021)   meclizine (ANTIVERT) 25 MG tablet Take 1 tablet (25 mg total) by mouth 2 (two) times daily as needed for dizziness. (Patient not taking: Reported on 12/25/2021)   polyethylene glycol (MIRALAX) 17 g packet Take 17 g by mouth daily  as needed. (Patient not taking: Reported on  12/25/2021)   Vitamin D, Ergocalciferol, (DRISDOL) 1.25 MG (50000 UNIT) CAPS capsule Take 1 capsule (50,000 Units total) by mouth every 7 (seven) days. (Patient not taking: Reported on 12/25/2021)   No facility-administered encounter medications on file as of 06/24/2022.    PAST MEDICAL HISTORY: Past Medical History:  Diagnosis Date   Hypertension     PAST SURGICAL HISTORY: No past surgical history on file.  ALLERGIES: Allergies  Allergen Reactions   Lisinopril Swelling    Edema in hands and feet   Norvasc [Amlodipine] Swelling    FAMILY HISTORY: Family History  Problem Relation Age of Onset   Breast cancer Mother        late 58's   Diabetes Maternal Grandmother    Diabetes Maternal Grandfather    Diabetes Paternal Grandmother    Diabetes Paternal Grandfather     SOCIAL HISTORY: Social History   Tobacco Use   Smoking status: Never   Smokeless tobacco: Never  Substance Use Topics   Alcohol use: No    Alcohol/week: 0.0 standard drinks of alcohol   Drug use: No   Social History   Social History Narrative   Not on file     OBJECTIVE: PHYSICAL EXAM: There were no vitals taken for this visit.  General:*** General appearance: Awake and alert. No distress. Cooperative with exam.  Skin: No obvious rash or jaundice. HEENT: Atraumatic. Anicteric. Lungs: Non-labored breathing on room air  Heart: Regular Abdomen: Soft, non tender. Extremities: No edema. No obvious deformity.  Musculoskeletal: No obvious joint swelling. Psych: Affect appropriate.  Neurological: Mental Status: Alert. Speech fluent. No pseudobulbar affect Cranial Nerves: CNII: No RAPD. Visual fields grossly intact. CNIII, IV, VI: PERRL. No nystagmus. EOMI. CN V: Facial sensation intact bilaterally to fine touch. [place] clench strong. Jaw jerk***. CN VII: Facial muscles symmetric and strong. No ptosis at rest or after sustained upgaze***. CN VIII:  Hearing grossly intact bilaterally. CN IX: No hypophonia. CN X: Palate elevates symmetrically. CN XI: Full strength shoulder shrug bilaterally. CN XII: Tongue protrusion full and midline. No atrophy or fasciculations. No significant dysarthria*** Motor: Tone is ***. *** fasciculations in *** extremities. *** atrophy. No grip or percussive myotonia.***  Individual muscle group testing (MRC grade out of 5):  Movement     Neck flexion ***    Neck extension ***     Right Left   Shoulder abduction *** ***   Shoulder adduction *** ***   Shoulder ext rotation *** ***   Shoulder int rotation *** ***   Elbow flexion *** ***   Elbow extension *** ***   Wrist extension *** ***   Wrist flexion *** ***   Finger abduction - FDI *** ***   Finger abduction - ADM *** ***   Finger extension *** ***   Finger distal flexion - 2/3 *** ***   Finger distal flexion - 4/5 *** ***   Thumb flexion - FPL *** ***   Thumb abduction - APB *** ***    Hip flexion *** ***   Hip extension *** ***   Hip adduction *** ***   Hip abduction *** ***   Knee extension *** ***   Knee flexion *** ***   Dorsiflexion *** ***   Plantarflexion *** ***   Inversion *** ***   Eversion *** ***   Great toe extension *** ***   Great toe flexion *** ***     Reflexes:  Right Left   Bicep *** ***   Tricep *** ***  BrRad *** ***   Knee *** ***   Ankle *** ***    Pathological Reflexes: Babinski: *** response bilaterally*** Hoffman: *** Troemner: *** Pectoral: *** Palmomental: *** Facial: *** Midline tap: *** Sensation: Pinprick: *** Vibration: *** Temperature: *** Proprioception: *** Coordination: Intact finger-to- nose-finger bilaterally. Romberg negative.*** Gait: Able to rise from chair with arms crossed unassisted. Normal, narrow-based gait. Able to tandem walk. Able to walk on toes and heels.***  Lab and Test Review: Internal labs: Normal or unremarkable: BMP, TSH, RPR and HIV (2019), ANA  (2019) B12 (12/25/21): 408 HbA1c (12/25/21): 5.4 Vit D (06/22/21): 12.4 Iron studies (06/22/21): iron 28, saturation low at 7%, ferritin low at 13 ***  External labs: ***  MRI brain wo contrast (12/13/17): Per my read: Scattered T2 hyperintensities, periventricular Radiology read: - Few scattered periventricular, subcortical and juxtacortical foci of non-specific T2 hyperintensities. These findings are non-specific and considerations include autoimmune, inflammatory, post-infectious, microvascular ischemic or migraine associated etiologies.  - No acute findings.  MRI cervical spine wo contrast (12/13/2017): Per my read: Normal Radiology read: Unremarkable MRI cervical spine (without). No intrinsic or compressive spinal cord lesions. No spinal stenosis or foraminal narrowing.   MRI thoracic spine wo contrast (12/13/17): Per my read: Normal Radiology read: Unremarkable MRI thoracic spine (without).      MRA head wo contrast (12/13/17): - The mid basilar artery has slightly irregular signal, and may represent mild (< 50%) stenosis / atherosclerosis vs artifact. - Bilateral cavernous carotid arteries have flow signal decline, likely due to technical artifact. The bilateral internal carotid arteries have no stenosis at the skull base, petrous segments, supraclinoid segments.  ***  ASSESSMENT: Kathryn Barrett is a 52 y.o. female who presents for evaluation of ***. *** has a relevant medical history of ***. *** neurological examination is pertinent for ***. Available diagnostic data is significant for ***. This constellation of symptoms and objective data would most likely localize to ***. ***  PLAN: -Blood work: *** ***  -Return to clinic ***  The impression above as well as the plan as outlined below were extensively discussed with the patient (in the company of ***) who voiced understanding. All questions were answered to their satisfaction.  The patient was counseled on pertinent  fall precautions per the printed material provided today, and as noted under the "Patient Instructions" section below.***  When available, results of the above investigations and possible further recommendations will be communicated to the patient via telephone/MyChart. Patient to call office if not contacted after expected testing turnaround time.   Total time spent reviewing records, interview, history/exam, documentation, and coordination of care on day of encounter:  *** min   Thank you for allowing me to participate in patient's care.  If I can answer any additional questions, I would be pleased to do so.  Jacquelyne Balint, MD   CC: Marcine Matar, MD [address] [place] [address]  CC: Referring provider: Marcine Matar, MD [address] [place],  [place] [address]

## 2022-06-24 ENCOUNTER — Ambulatory Visit (INDEPENDENT_AMBULATORY_CARE_PROVIDER_SITE_OTHER): Payer: Self-pay | Admitting: Neurology

## 2022-06-24 ENCOUNTER — Encounter: Payer: Self-pay | Admitting: Neurology

## 2022-06-24 VITALS — BP 145/91 | HR 83 | Ht 66.0 in | Wt 255.0 lb

## 2022-06-24 DIAGNOSIS — E559 Vitamin D deficiency, unspecified: Secondary | ICD-10-CM

## 2022-06-24 DIAGNOSIS — E611 Iron deficiency: Secondary | ICD-10-CM

## 2022-06-24 DIAGNOSIS — R209 Unspecified disturbances of skin sensation: Secondary | ICD-10-CM

## 2022-06-24 DIAGNOSIS — R930 Abnormal findings on diagnostic imaging of skull and head, not elsewhere classified: Secondary | ICD-10-CM

## 2022-06-24 DIAGNOSIS — G379 Demyelinating disease of central nervous system, unspecified: Secondary | ICD-10-CM

## 2022-06-24 NOTE — Patient Instructions (Addendum)
I saw you today for burning pain. I am not sure the cause of your symptoms currently, but want to investigate further with: -blood work today -MRI brain and cervical (neck) spine w/wo contrast  I will be in touch when I have your results.  Continue gabapentin 300 mg at night for your symptoms.  You have low ferritin/iron deficiency potentially causing restless leg syndrome symptoms.    -Continue ferrous sulfate 325 mg daily x 3 months.  Discussed not to take with milk.  Discussed that this can cause constipation and to drink plenty of water.    -take vitamin C, 100-200 mg with each ferrous sulfate dose, or take the ferrous sulfate with small glass of orange juice (if not diabetic)  -will recheck ferritin in the future     I would like to see you back in clinic in 3 months. Please let me know if you have any questions or concerns in the meantime.   The physicians and staff at [facility] are committed to providing excellent care. You may receive a survey requesting feedback about your experience at our office. We strive to receive "very good" responses to the survey questions. If you feel that your experience would prevent you from giving the office a "very good " response, please contact our office to try to remedy the situation. We may be reached at [phone]. Thank you for taking the time out of your busy day to complete the survey.  Jacquelyne Balint, MD [facility]

## 2022-06-25 NOTE — Addendum Note (Signed)
Addended by: Lenise Herald on: 06/25/2022 09:51 AM   Modules accepted: Orders

## 2022-07-13 ENCOUNTER — Encounter: Payer: Self-pay | Admitting: Internal Medicine

## 2022-07-13 ENCOUNTER — Other Ambulatory Visit: Payer: Self-pay

## 2022-07-13 ENCOUNTER — Ambulatory Visit: Payer: Self-pay | Attending: Internal Medicine | Admitting: Internal Medicine

## 2022-07-13 VITALS — BP 130/82 | HR 69 | Temp 98.1°F | Ht 66.0 in | Wt 253.0 lb

## 2022-07-13 DIAGNOSIS — Z23 Encounter for immunization: Secondary | ICD-10-CM

## 2022-07-13 DIAGNOSIS — Z6841 Body Mass Index (BMI) 40.0 and over, adult: Secondary | ICD-10-CM

## 2022-07-13 DIAGNOSIS — R202 Paresthesia of skin: Secondary | ICD-10-CM

## 2022-07-13 DIAGNOSIS — D500 Iron deficiency anemia secondary to blood loss (chronic): Secondary | ICD-10-CM

## 2022-07-13 DIAGNOSIS — Z1211 Encounter for screening for malignant neoplasm of colon: Secondary | ICD-10-CM

## 2022-07-13 DIAGNOSIS — I10 Essential (primary) hypertension: Secondary | ICD-10-CM

## 2022-07-13 DIAGNOSIS — R42 Dizziness and giddiness: Secondary | ICD-10-CM

## 2022-07-13 MED ORDER — GABAPENTIN 100 MG PO CAPS
100.0000 mg | ORAL_CAPSULE | Freq: Every day | ORAL | 1 refills | Status: DC
  Filled 2022-07-13: qty 30, 30d supply, fill #0
  Filled 2022-12-22: qty 30, 30d supply, fill #1

## 2022-07-13 NOTE — Patient Instructions (Signed)
Decrease Gabapentin to 100 mg at bedtime. If it still causes dizziness, you can stop the medication.  Healthy Eating Following a healthy eating pattern may help you to achieve and maintain a healthy body weight, reduce the risk of chronic disease, and live a long and productive life. It is important to follow a healthy eating pattern at an appropriate calorie level for your body. Your nutritional needs should be met primarily through food by choosing a variety of nutrient-rich foods. What are tips for following this plan? Reading food labels Read labels and choose the following: Reduced or low sodium. Juices with 100% fruit juice. Foods with low saturated fats and high polyunsaturated and monounsaturated fats. Foods with whole grains, such as whole wheat, cracked wheat, brown rice, and wild rice. Whole grains that are fortified with folic acid. This is recommended for women who are pregnant or who want to become pregnant. Read labels and avoid the following: Foods with a lot of added sugars. These include foods that contain brown sugar, corn sweetener, corn syrup, dextrose, fructose, glucose, high-fructose corn syrup, honey, invert sugar, lactose, malt syrup, maltose, molasses, raw sugar, sucrose, trehalose, or turbinado sugar. Do not eat more than the following amounts of added sugar per day: 6 teaspoons (25 g) for women. 9 teaspoons (38 g) for men. Foods that contain processed or refined starches and grains. Refined grain products, such as white flour, degermed cornmeal, white bread, and white rice. Shopping Choose nutrient-rich snacks, such as vegetables, whole fruits, and nuts. Avoid high-calorie and high-sugar snacks, such as potato chips, fruit snacks, and candy. Use oil-based dressings and spreads on foods instead of solid fats such as butter, stick margarine, or cream cheese. Limit pre-made sauces, mixes, and "instant" products such as flavored rice, instant noodles, and ready-made  pasta. Try more plant-protein sources, such as tofu, tempeh, black beans, edamame, lentils, nuts, and seeds. Explore eating plans such as the Mediterranean diet or vegetarian diet. Cooking Use oil to saut or stir-fry foods instead of solid fats such as butter, stick margarine, or lard. Try baking, boiling, grilling, or broiling instead of frying. Remove the fatty part of meats before cooking. Steam vegetables in water or broth. Meal planning  At meals, imagine dividing your plate into fourths: One-half of your plate is fruits and vegetables. One-fourth of your plate is whole grains. One-fourth of your plate is protein, especially lean meats, poultry, eggs, tofu, beans, or nuts. Include low-fat dairy as part of your daily diet. Lifestyle Choose healthy options in all settings, including home, work, school, restaurants, or stores. Prepare your food safely: Wash your hands after handling raw meats. Keep food preparation surfaces clean by regularly washing with hot, soapy water. Keep raw meats separate from ready-to-eat foods, such as fruits and vegetables. Cook seafood, meat, poultry, and eggs to the recommended internal temperature. Store foods at safe temperatures. In general: Keep cold foods at 38F (4.4C) or below. Keep hot foods at 138F (60C) or above. Keep your freezer at [facility] (-17.8C) or below. Foods are no longer safe to eat when they have been between the temperatures of 40-138F (4.4-60C) for more than 2 hours. What foods should I eat? Fruits Aim to eat 2 cup-equivalents of fresh, canned (in natural juice), or frozen fruits each day. Examples of 1 cup-equivalent of fruit include 1 small apple, 8 large strawberries, 1 cup canned fruit,  cup dried fruit, or 1 cup 100% juice. Vegetables Aim to eat 2-3 cup-equivalents of fresh and frozen vegetables each day,  including different varieties and colors. Examples of 1 cup-equivalent of vegetables include 2 medium carrots, 2 cups  raw, leafy greens, 1 cup chopped vegetable (raw or cooked), or 1 medium baked potato. Grains Aim to eat 6 ounce-equivalents of whole grains each day. Examples of 1 ounce-equivalent of grains include 1 slice of bread, 1 cup ready-to-eat cereal, 3 cups popcorn, or  cup cooked rice, pasta, or cereal. Meats and other proteins Aim to eat 5-6 ounce-equivalents of protein each day. Examples of 1 ounce-equivalent of protein include 1 egg, 1/2 cup nuts or seeds, or 1 tablespoon (16 g) peanut butter. A cut of meat or fish that is the size of a deck of cards is about 3-4 ounce-equivalents. Of the protein you eat each week, try to have at least 8 ounces come from seafood. This includes salmon, trout, herring, and anchovies. Dairy Aim to eat 3 cup-equivalents of fat-free or low-fat dairy each day. Examples of 1 cup-equivalent of dairy include 1 cup (240 mL) milk, 8 ounces (250 g) yogurt, 1 ounces (44 g) natural cheese, or 1 cup (240 mL) fortified soy milk. Fats and oils Aim for about 5 teaspoons (21 g) per day. Choose monounsaturated fats, such as canola and olive oils, avocados, peanut butter, and most nuts, or polyunsaturated fats, such as sunflower, corn, and soybean oils, walnuts, pine nuts, sesame seeds, sunflower seeds, and flaxseed. Beverages Aim for six 8-oz glasses of water per day. Limit coffee to three to five 8-oz cups per day. Limit caffeinated beverages that have added calories, such as soda and energy drinks. Limit alcohol intake to no more than 1 drink a day for nonpregnant women and 2 drinks a day for men. One drink equals 12 oz of beer (355 mL), 5 oz of wine (148 mL), or 1 oz of hard liquor (44 mL). Seasoning and other foods Avoid adding excess amounts of salt to your foods. Try flavoring foods with herbs and spices instead of salt. Avoid adding sugar to foods. Try using oil-based dressings, sauces, and spreads instead of solid fats. This information is based on general [place] nutrition  guidelines. For more information, visit [employer]. Exact amounts may vary based on your nutrition needs. Summary A healthy eating plan may help you to maintain a healthy weight, reduce the risk of chronic diseases, and stay active throughout your life. Plan your meals. Make sure you eat the right portions of a variety of nutrient-rich foods. Try baking, boiling, grilling, or broiling instead of frying. Choose healthy options in all settings, including home, work, school, restaurants, or stores. This information is not intended to replace advice given to you by your health care provider. Make sure you discuss any questions you have with your health care provider. Document Revised: 12/23/2021 Document Reviewed: 03/10/2021 Elsevier Patient Education  [place].

## 2022-07-13 NOTE — Progress Notes (Signed)
Patient ID: Kathryn Barrett, female    DOB: 21-Jun-1971  MRN: [id number]  CC: Follow-up (Dizziness a few weeks ago. )   Subjective: Unknown Kathryn Barrett is a 51 y.o. female who presents for chronic ds management Her concerns today include:  Pt with hx of HTN, obesity and anemia due to menorrhagia.     HTN: She reports compliance with taking HCTZ but has not taken as yet for the morning.  She checks blood pressure but not often.  Iron deficiency anemia: Taking iron supplement 1 to twice a day.  She is overdue for CBC and iron studies.  She continues to get menses but they have become somewhat irregular with age.  She had light bleeding in October.  No menses in November.  Then she had menstrual cycle this month that started on the 15th and lasted 3 days with heavy bleeding.  Overdue for Pap smear.  She would like to be rescheduled for this.  Obesity: She eats 2 meals a day 1 of which is usually a smoothie in the morning.  She drinks sodas including [employer] and Pepsi.  For her meat she eats chicken, steak and ribs.  Does not eat fruits every day.  No exercise outside of work.  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  Had seen Dr. Alvis Lemmings a few months ago with some paresthesia in one of the legs.  She was prescribed gabapentin.  She saw the neurologist.  MRI of the head and cervical spine ordered which she has not had done as yet.  She is uninsured.  Reports that the gabapentin 300 mg causes dizziness when she takes it.  Patient Active Problem List   Diagnosis Date Noted   Numbness of right foot 06/22/2021   Numbness and tingling of left hand 06/22/2021   Stress 06/22/2021   Hot flushes, perimenopausal 06/22/2021   Class 3 severe obesity due to excess calories with serious comorbidity and body mass index (BMI) of 40.0 to 44.9 in adult [facility]) 07/07/2017   Iron deficiency anemia due to chronic blood loss 11/08/2016   Prediabetes 01/01/2014   Essential hypertension 01/01/2014   Menorrhagia 01/01/2014    Breast lump on left side at 6 o'clock position 07/31/2013     Current Outpatient Medications on File Prior to Visit  Medication Sig Dispense Refill   ferrous sulfate (FEROSUL) 325 (65 FE) MG tablet Take 1 tablet (325 mg total) by mouth 2 (two) times daily with a meal. 120 tablet 2   hydrochlorothiazide (HYDRODIURIL) 25 MG tablet TAKE 1 TABLET (25 MG TOTAL) BY MOUTH DAILY. 90 tablet 1   polyethylene glycol (MIRALAX) 17 g packet Take 17 g by mouth daily as needed. (Patient not taking: Reported on 07/13/2022) 30 each 3   Vitamin D, Ergocalciferol, (DRISDOL) 1.25 MG (50000 UNIT) CAPS capsule Take 1 capsule (50,000 Units total) by mouth every 7 (seven) days. (Patient not taking: Reported on 07/13/2022) 4 capsule 2   No current facility-administered medications on file prior to visit.    Allergies  Allergen Reactions   Lisinopril Swelling    Edema in hands and feet   Norvasc [Amlodipine] Swelling    Social History   Socioeconomic History   Marital status: Single    Spouse name: Not on file   Number of children: Not on file   Years of education: Not on file   Highest education level: Not on file  Occupational History   Not on file  Tobacco Use   Smoking status: Never  Smokeless tobacco: Never  Vaping Use   Vaping Use: Never used  Substance and Sexual Activity   Alcohol use: No    Alcohol/week: 0.0 standard drinks of alcohol   Drug use: No   Sexual activity: Yes    Partners: Male    Birth control/protection: None  Other Topics Concern   Not on file  Social History Narrative   Are you right handed or left handed? right   Are you currently employed ? 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   Who lives with you?    What type of home do you live in: 1 story or 2 story?        Social Determinants of Health   Financial Resource Strain: Not on file  Food Insecurity: Not on file  Transportation Needs: Not on file   Physical Activity: Not on file  Stress: Not on file  Social Connections: Not on file  Intimate Partner Violence: Not on file    Family History  Problem Relation Age of Onset   Breast cancer Mother        late 75's   Diabetes Maternal Grandmother    Diabetes Maternal Grandfather    Diabetes Paternal Grandmother    Diabetes Paternal Grandfather     No past surgical history on file.  ROS: Review of Systems Negative except as stated above  PHYSICAL EXAM: BP 130/82 (BP Location: Left Arm, Patient Position: Sitting, Cuff Size: Large)   Pulse 69   Temp 98.1 F (36.7 C) (Oral)   Ht 5\' 6"  (1.676 m)   Wt 253 lb (114.8 kg)   SpO2 99%   BMI 40.84 kg/m   Wt Readings from Last 3 Encounters:  07/13/22 253 lb (114.8 kg)  06/24/22 255 lb (115.7 kg)  12/25/21 253 lb 6.4 oz (114.9 kg)    Physical Exam  General appearance - alert, well appearing, middle-age obese African-American female and in no distress Mental status - normal mood, behavior, speech, dress, motor activity, and thought processes Neck - supple, no significant adenopathy Chest - clear to auscultation, no wheezes, rales or rhonchi, symmetric air entry Heart - normal rate, regular rhythm, normal S1, S2, no murmurs, rubs, clicks or gallops Extremities - peripheral pulses normal, no pedal edema, no clubbing or cyanosis      Latest Ref Rng & Units 12/25/2021    3:33 PM 02/05/2021   12:13 PM 12/05/2019    9:51 AM  CMP  Glucose 70 - 99 mg/dL 90  92  02/04/2020   BUN 6 - 24 mg/dL 10  11  13    Creatinine 0.57 - 1.00 mg/dL 268   3.41   Sodium 134 - 144 mmol/L 140  141  140   Potassium 3.5 - 5.2 mmol/L 3.6  4.6  4.1   Chloride 96 - 106 mmol/L 100  102  102   CO2 20 - 29 mmol/L 26  23  22    Calcium 8.7 - 10.2 mg/dL 9.62  9.2  9.5   Total Protein 6.0 - 8.5 g/dL  7.4    Total Bilirubin 0.0 - 1.2 mg/dL  0.3    Alkaline Phos 44 - 121 IU/L  102    AST 0 - 40 IU/L  10    ALT 0 - 32 IU/L  11     Lipid Panel     Component  Value Date/Time   CHOL 125  02/05/2021 1213   TRIG 47 02/05/2021 1213   HDL 40 02/05/2021 1213   CHOLHDL 3.1 02/05/2021 1213   CHOLHDL 3.1 11/02/2013 1026   VLDL 10 11/02/2013 1026   LDLCALC 74 02/05/2021 1213    CBC    Component Value Date/Time   WBC 6.1 06/22/2021 1424   WBC 9.9 11/08/2016 0441   RBC 4.60 06/22/2021 1424   RBC 4.97 11/08/2016 0441   HGB 11.6 06/22/2021 1424   HCT 37.1 06/22/2021 1424   PLT 285 06/22/2021 1424   MCV 81 06/22/2021 1424   MCH 25.2 (L) 06/22/2021 1424   MCH 18.3 (L) 11/08/2016 0441   MCHC 31.3 (L) 06/22/2021 1424   MCHC 30.7 11/08/2016 0441   RDW 14.1 06/22/2021 1424   LYMPHSABS 2.3 06/22/2021 1424   MONOABS 0.7 01/31/2015 1215   EOSABS 0.2 06/22/2021 1424   BASOSABS 0.1 06/22/2021 1424    ASSESSMENT AND PLAN:  1. Essential hypertension Close to goal.  Continue HCTZ and low-salt diet.  2. Iron deficiency anemia due to chronic blood loss Continue iron supplement.  We will check blood cell count and iron levels. She is in perimenopausal phase in terms of vaginal bleeding. - CBC - Iron, TIBC and Ferritin Panel  3. Dizziness Recommend either stopping the gabapentin or we can try decreasing the dose to 100 mg at bedtime.  She would like to try the decreased dose.  4. Class 3 severe obesity due to excess calories with serious comorbidity and body mass index (BMI) of 40.0 to 44.9 in adult [facility] Patient advised to eliminate sugary drinks from the diet, cut back on portion sizes especially of white carbohydrates, eat more white lean meat like chicken [place] and seafood instead of beef or pork and incorporate fresh fruits and vegetables into the diet daily. Encouraged her to try to get in some exercise outside of work with goal of at least 30 minutes 5 days a week of moderate intensity exercise.  5. Need for influenza vaccination She would like to get the flu shot but does not want it today.  She would like to come back in 2 to 3 weeks to  receive it.  Appointment has been made.  6. Paresthesia - gabapentin (NEURONTIN) 100 MG capsule; Take 1 capsule (100 mg total) by mouth at bedtime.  Dispense: 30 capsule; Refill: 1  7. Screening for colon cancer - Fecal occult blood, imunochemical([facility])    Patient was given the opportunity to ask questions.  Patient verbalized understanding of the plan and was able to repeat key elements of the plan.   This documentation was completed using Paediatric nurse.  Any transcriptional errors are unintentional.  Orders Placed This Encounter  Procedures   Fecal occult blood, imunochemical([facility])   CBC   Iron, TIBC and Ferritin Panel     Requested Prescriptions   Signed Prescriptions Disp Refills   gabapentin (NEURONTIN) 100 MG capsule 30 capsule 1    Sig: Take 1 capsule (100 mg total) by mouth at bedtime.    Return in about 4 weeks (around 08/10/2022) for Give appt for flu and shingles vaccines  with [facility] or [facility]MA, PAP.  Jonah Blue, MD, FACP

## 2022-07-14 LAB — CBC
Hematocrit: 32.5 % — ABNORMAL LOW (ref 34.0–46.6)
Hemoglobin: 10.5 g/dL — ABNORMAL LOW (ref 11.1–15.9)
MCH: 25.2 pg — ABNORMAL LOW (ref 26.6–33.0)
MCHC: 32.3 g/dL (ref 31.5–35.7)
MCV: 78 fL — ABNORMAL LOW (ref 79–97)
Platelets: 322 10*3/uL (ref 150–450)
RBC: 4.17 x10E6/uL (ref 3.77–5.28)
RDW: 15.3 % (ref 11.7–15.4)
WBC: 7.9 10*3/uL (ref 3.4–10.8)

## 2022-07-14 LAB — IRON,TIBC AND FERRITIN PANEL
Ferritin: 32 ng/mL (ref 15–150)
Iron Saturation: 8 % — CL (ref 15–55)
Iron: 32 ug/dL (ref 27–159)
Total Iron Binding Capacity: 380 ug/dL (ref 250–450)
UIBC: 348 ug/dL (ref 131–425)

## 2022-07-28 ENCOUNTER — Ambulatory Visit
Admission: RE | Admit: 2022-07-28 | Discharge: 2022-07-28 | Disposition: A | Discharge status: Home / Self Care | Payer: Self-pay | Source: Ambulatory Visit | Attending: Neurology | Admitting: Neurology

## 2022-07-28 DIAGNOSIS — E611 Iron deficiency: Secondary | ICD-10-CM

## 2022-07-28 DIAGNOSIS — E559 Vitamin D deficiency, unspecified: Secondary | ICD-10-CM

## 2022-07-28 DIAGNOSIS — R209 Unspecified disturbances of skin sensation: Secondary | ICD-10-CM

## 2022-07-28 DIAGNOSIS — G379 Demyelinating disease of central nervous system, unspecified: Secondary | ICD-10-CM

## 2022-07-28 DIAGNOSIS — R930 Abnormal findings on diagnostic imaging of skull and head, not elsewhere classified: Secondary | ICD-10-CM

## 2022-08-24 ENCOUNTER — Ambulatory Visit: Payer: Self-pay

## 2022-08-24 ENCOUNTER — Ambulatory Visit: Payer: Self-pay | Admitting: *Deleted

## 2022-08-24 NOTE — Telephone Encounter (Signed)
  Chief Complaint: dizziness Symptoms: dizziness- hx anemia- due OV follow up Frequency: on/off- 1 week Pertinent Negatives: Patient denies fever, chest pain, vomiting, diarrhea, bleeding  Disposition: [] ED /[] Urgent Care (no appt availability in office) / [] Appointment(In office/virtual)/ []  [place] [facility]/ [] Home Care/ [] Refused Recommended Disposition /[x] [place] [facility]/ []  Follow-up with PCP Additional Notes: Patient was to schedule f/u 1 month to check anemia- no office appointment- advised mobile unit- patient states she will go- ut wants appointment with PCP- advised I would send note

## 2022-08-24 NOTE — Telephone Encounter (Signed)
Reason for Disposition  [1] MILD dizziness (e.g., walking normally) AND [2] has been evaluated by doctor (or NP/PA) for this  Answer Assessment - Initial Assessment Questions 1. DESCRIPTION: "Describe your dizziness."     Dizzy with standing- comes and goes 2. LIGHTHEADED: "Do you feel lightheaded?" (e.g., somewhat faint, woozy, weak upon standing)     yes 3. VERTIGO: "Do you feel like either you or the room is spinning or tilting?" (i.e. vertigo)     no 4. SEVERITY: "How bad is it?"  "Do you feel like you are going to faint?" "Can you stand and walk?"   - MILD: Feels slightly dizzy, but walking normally.   - MODERATE: Feels unsteady when walking, but not falling; interferes with normal activities (e.g., school, work).   - SEVERE: Unable to walk without falling, or requires assistance to walk without falling; feels like passing out now.      Mild- walks normally 5. ONSET:  "When did the dizziness begin?"     1 week- comes and goes 6. AGGRAVATING FACTORS: "Does anything make it worse?" (e.g., standing, change in head position)     standing 7. HEART RATE: "Can you tell me your heart rate?" "How many beats in 15 seconds?"  (Note: not all patients can do this)       Normal BP- Pulse 8. CAUSE: "What do you think is causing the dizziness?"     anemia 9. RECURRENT SYMPTOM: "Have you had dizziness before?" If Yes, ask: "When was the last time?" "What happened that time?"     Yes- patient was treated 10. OTHER SYMPTOMS: "Do you have any other symptoms?" (e.g., fever, chest pain, vomiting, diarrhea, bleeding)       no  Protocols used: Dizziness - Lightheadedness-A-AH

## 2022-09-09 ENCOUNTER — Other Ambulatory Visit: Payer: Self-pay | Admitting: Family Medicine

## 2022-09-09 DIAGNOSIS — I10 Essential (primary) hypertension: Secondary | ICD-10-CM

## 2022-09-10 ENCOUNTER — Other Ambulatory Visit: Payer: Self-pay

## 2022-09-10 MED ORDER — HYDROCHLOROTHIAZIDE 25 MG PO TABS
ORAL_TABLET | Freq: Every day | ORAL | 0 refills | Status: DC
  Filled 2022-09-10: qty 90, 90d supply, fill #0

## 2022-09-20 NOTE — Progress Notes (Signed)
NEUROLOGY FOLLOW UP OFFICE NOTE  Kathryn Barrett [id number]  Subjective:  Kathryn Barrett is a 52 y.o. year old right-handed female with a medical history of HTN, vit D deficiency who we last saw on 06/24/22.  To briefly review: Patient is having burning in thighs and legs. It is always one side or the other, and can vary from right or left (more on the left). It happens mostly when she lays down at night. It can go all the way up on one side to the head. This has been going on for about 5 months. She was put on gabapentin 300 mg qhs around 12/2021 that helped some (does not happen as often). She is not sure if there is an urge to move, but when she gets up, this can help her symptoms.   She denies previous symptoms of vision loss in one eye or weakness on one side of the body. She denies feeling a tight sensation in chest or abdomen (MS hug type symptoms). She has constipation, but denies incontinence of bowel or bladder. She denies saddle anesthesia.    She denies neck or back pain.   The patient has not noticed any recent skin rashes nor does she report any constitutional symptoms like fever, night sweats, anorexia or unintentional weight loss. She does endorse feeling hot a lot. She still gets her cycle and not noticed changes.   Patient was seen at [facility] in 2019 for acute onset of left foot numbness. MS was considered, but MRI brain, cervical spine, and thoracic spine without contrast were not definitive for demyelinating disease. Around that time, she was started on iron supplementation (for about 4 years).   Patient takes a Vit D gummy, but not sure the amount in it.   EtOH use: None  Restrictive diet? No Family history of neuropathy/myopathy/NM disease? No  Most recent Assessment and Plan (06/24/22): Her neurological examination is pertinent for ?hyperreflexia but symmetric, otherwise unremarkable examination. Available diagnostic data is significant for MRI brain wo contrast in  2019 with scattered periventricular T2 hyperintensities, unremarkable cervical and thoracic spine. Her vit D, iron, and ferritin were also low 1 year ago. The etiology of patient's symptoms is currently unclear. She does have some features of restless legs, low ferritin, and improvement with gabapentin, but the focal nature of symptoms (one side at a time) is unusual. Her previous MRI brain showed some concern for periventricular lesions, perhaps consistent with demyelinating disease. I would like to check blood work and repeat MRI brain and cervical spine today to further evaluate.   PLAN: -Blood work: vit D, iron studies, B1, Hep panel, HIV, TB, JC virus -MRI brain and cervical spine w/wo contrast -Continue Gabapentin 300 mg qhs -Continue ferrous sulfate 325 mg daily, take with vit C or orange juice  Since their last visit: Patient did not get labwork. She tried to get MRI but was unable to tolerate this.  Gabapentin was making her dizzy, so this was decreased to 100 mg qhs by PCP on 07/12/22. Patient's symptoms of burning in the legs has not been present since restarting gabapentin nightly.  She is taking iron supplement, perhaps inconsistently. She takes vit D gummies.  MEDICATIONS:  Outpatient Encounter Medications as of 09/29/2022  Medication Sig   ferrous sulfate (FEROSUL) 325 (65 FE) MG tablet Take 1 tablet (325 mg total) by mouth 2 (two) times daily with a meal.   gabapentin (NEURONTIN) 100 MG capsule Take 1 capsule (100 mg total) by mouth at  bedtime.   hydrochlorothiazide (HYDRODIURIL) 25 MG tablet TAKE 1 TABLET (25 MG TOTAL) BY MOUTH DAILY.   polyethylene glycol (MIRALAX) 17 g packet Take 17 g by mouth daily as needed.   Vitamin D, Ergocalciferol, (DRISDOL) 1.25 MG (50000 UNIT) CAPS capsule Take 1 capsule (50,000 Units total) by mouth every 7 (seven) days. (Patient taking differently: Take 50,000 Units by mouth every 7 (seven) days. gummies)   No facility-administered encounter  medications on file as of 09/29/2022.    PAST MEDICAL HISTORY: Past Medical History:  Diagnosis Date   Hypertension     PAST SURGICAL HISTORY: History reviewed. No pertinent surgical history.  ALLERGIES: Allergies  Allergen Reactions   Lisinopril Swelling    Edema in hands and feet   Norvasc [Amlodipine] Swelling    FAMILY HISTORY: Family History  Problem Relation Age of Onset   Breast cancer Mother        late 49's   Diabetes Maternal Grandmother    Diabetes Maternal Grandfather    Diabetes Paternal Grandmother    Diabetes Paternal Grandfather     SOCIAL HISTORY: Social History   Tobacco Use   Smoking status: Never   Smokeless tobacco: Never  Vaping Use   Vaping Use: Never used  Substance Use Topics   Alcohol use: No    Alcohol/week: 0.0 standard drinks of alcohol   Drug use: No   Social History   Social History Narrative   Are you right handed or left handed? right   Are you currently employed ? 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    What type of home do you live in: 1 story or 2 story? one    Caffeine none      Objective:  Vital Signs:  BP (!) 144/84   Pulse 87   Ht '5\' 6"'$  (1.676 m)   Wt 263 lb (119.3 kg)   SpO2 100%   BMI 42.45 kg/m   General: No acute distress.  Patient appears well-groomed.   Head:  Normocephalic/atraumatic Neck: supple, full range of motion Lungs:  Non-labored breathing on room air Neurological Exam: alert and oriented.  Speech fluent and not dysarthric, language intact.  CN II-XII intact. Bulk and tone normal, muscle strength 5/5 throughout.  Sensation to light touch intact.  Deep tendon reflexes 2+ throughout.  Finger to nose testing intact.  Gait normal, Romberg negative.   Labs and Imaging review: New results: Iron studies (07/13/22): iron 32, saturation low at 8%, ferritin 32  Previously reviewed results: Normal or  unremarkable: BMP, TSH, RPR and HIV (2019), ANA (2019) B12 (12/25/21): 408 HbA1c (12/25/21): 5.4 Vit D (06/22/21): 12.4 Iron studies (06/22/21): iron 28, saturation low at 7%, ferritin low at 13   MRI brain wo contrast (12/13/17): Per my read: Scattered T2 hyperintensities, periventricular Radiology read: - Few scattered periventricular, subcortical and juxtacortical foci of non-specific T2 hyperintensities. These findings are non-specific and considerations include autoimmune, inflammatory, post-infectious, microvascular ischemic or migraine associated etiologies.  - No acute findings.   MRI cervical spine wo contrast (12/13/2017): Per my read: Normal Radiology read: Unremarkable MRI cervical spine (without). No intrinsic or compressive spinal cord lesions. No spinal stenosis or foraminal narrowing.    MRI thoracic spine wo contrast (12/13/17): Per my read: Normal Radiology read: Unremarkable MRI thoracic spine (without).       MRA head wo contrast (12/13/17): - The  mid basilar artery has slightly irregular signal, and may represent mild (< 50%) stenosis / atherosclerosis vs artifact. - Bilateral cavernous carotid arteries have flow signal decline, likely due to technical artifact. The bilateral internal carotid arteries have no stenosis at the skull base, petrous segments, supraclinoid segments.   Assessment/Plan:  This is Kathryn Barrett, a 52 y.o. female who was initially seen for burning in her legs. Since restarting nightly gabapentin, patient's symptoms have resolved. Previous MRI brain from 2019 showed scattered T2 hyperintensities in periventricular areas, raising concern for possible demyelinating disease. MRI cervical and thoracic spine were unremarkable. I attempted to repeat MRIs to look for evolution, but patient could not tolerate the MRI. Given that she is currently asymptomatic and symptoms do not clearly sound like demyelinating disease, after discussion today, patient agreed  to monitor symptoms and defer this testing for now   Plan: -Continue vit D and iron supplementation  -Will hold off on investigations and monitor symptoms per patient preference -Continue gabapentin 100 mg qhs  Return to clinic as needed  Total time spent reviewing records, interview, history/exam, documentation, and coordination of care on day of encounter:  20 min  Kai Levins, MD

## 2022-09-28 ENCOUNTER — Other Ambulatory Visit: Payer: Self-pay

## 2022-09-29 ENCOUNTER — Other Ambulatory Visit: Payer: Self-pay

## 2022-09-29 ENCOUNTER — Encounter: Payer: Self-pay | Admitting: Neurology

## 2022-09-29 ENCOUNTER — Ambulatory Visit: Payer: BLUE CROSS/BLUE SHIELD | Admitting: Neurology

## 2022-09-29 VITALS — BP 144/84 | HR 87 | Ht 66.0 in | Wt 263.0 lb

## 2022-09-29 DIAGNOSIS — R930 Abnormal findings on diagnostic imaging of skull and head, not elsewhere classified: Secondary | ICD-10-CM

## 2022-09-29 DIAGNOSIS — E559 Vitamin D deficiency, unspecified: Secondary | ICD-10-CM

## 2022-09-29 DIAGNOSIS — E611 Iron deficiency: Secondary | ICD-10-CM | POA: Diagnosis not present

## 2022-09-29 DIAGNOSIS — R209 Unspecified disturbances of skin sensation: Secondary | ICD-10-CM

## 2022-09-29 NOTE — Patient Instructions (Signed)
Your symptoms having improved with nightly gabapentin. I would recommend continuing this to prevent symptoms.  I recommend continue iron supplement and vitamin D supplement as well.  We will hold off on your additional blood work and MRIs that have not been completed for now based on our discussion.  If you have new symptoms or numbness, tingling, weakness, or vision changes, please contact me as we need to re-evaluate.  The physicians and staff at [facility] are committed to providing excellent care. You may receive a survey requesting feedback about your experience at our office. We strive to receive "very good" responses to the survey questions. If you feel that your experience would prevent you from giving the office a "very good " response, please contact our office to try to remedy the situation. We may be reached at [phone]. Thank you for taking the time out of your busy day to complete the survey.  Kai Levins, MD [facility]

## 2022-12-07 ENCOUNTER — Other Ambulatory Visit: Payer: Self-pay | Admitting: Internal Medicine

## 2022-12-07 DIAGNOSIS — Z0000 Encounter for general adult medical examination without abnormal findings: Secondary | ICD-10-CM

## 2022-12-22 ENCOUNTER — Other Ambulatory Visit: Payer: Self-pay | Admitting: Internal Medicine

## 2022-12-22 DIAGNOSIS — I10 Essential (primary) hypertension: Secondary | ICD-10-CM

## 2022-12-23 ENCOUNTER — Other Ambulatory Visit: Payer: Self-pay

## 2022-12-23 ENCOUNTER — Other Ambulatory Visit: Payer: Self-pay | Admitting: Internal Medicine

## 2022-12-23 DIAGNOSIS — I10 Essential (primary) hypertension: Secondary | ICD-10-CM

## 2022-12-23 MED ORDER — HYDROCHLOROTHIAZIDE 25 MG PO TABS
25.0000 mg | ORAL_TABLET | Freq: Every day | ORAL | 0 refills | Status: DC
  Filled 2022-12-23: qty 90, 90d supply, fill #0

## 2022-12-23 NOTE — Telephone Encounter (Signed)
Medication Refill - Medication: hydrochlorothiazide (HYDRODIURIL) 25 MG tablet   Pt states that she has two pills left. Pt scheduled an appt for 03/07/23 for med refill. Pt is wanting to see if she can get a short supply to last until her appt.    Has the patient contacted their pharmacy? No.   Preferred Pharmacy (with phone number or street name):  [facility] Phone: [phone]  Fax: [phone]     Has the patient been seen for an appointment in the last year OR does the patient have an upcoming appointment? Yes.    Agent: Please be advised that RX refills may take up to 3 business days. We ask that you follow-up with your pharmacy.

## 2022-12-23 NOTE — Telephone Encounter (Signed)
Requested Prescriptions  Pending Prescriptions Disp Refills   hydrochlorothiazide (HYDRODIURIL) 25 MG tablet 90 tablet 0    Sig: TAKE 1 TABLET (25 MG TOTAL) BY MOUTH DAILY.     Cardiovascular: Diuretics - Thiazide Failed - 12/23/2022  9:08 AM      Failed - Cr in normal range and within 180 days    Creat  Date Value Ref Range Status  01/01/2014 0.76 0.50 - 1.10 mg/dL Final   Creatinine, Ser  Date Value Ref Range Status  12/25/2021 1.00 0.57 - 1.00 mg/dL Final         Failed - K in normal range and within 180 days    Potassium  Date Value Ref Range Status  12/25/2021 3.6 3.5 - 5.2 mmol/L Final         Failed - Na in normal range and within 180 days    Sodium  Date Value Ref Range Status  12/25/2021 140 134 - 144 mmol/L Final         Failed - Last BP in normal range    BP Readings from Last 1 Encounters:  09/29/22 (!) 144/84         Passed - Valid encounter within last 6 months    Recent Outpatient Visits           5 months ago Essential hypertension   [place] [facility] Marcine Matar, MD   12 months ago Paresthesia   [place] [facility] Hoy Register, MD   1 year ago Positive self-administered antigen test for COVID-19   [facility] Claiborne Rigg, NP   1 year ago Numbness of right foot   [facility] [place], [place], [place]   1 year ago Essential hypertension   [place] [facility] Marcine Matar, MD       Future Appointments             In 2 months Claiborne Rigg, NP [facility]

## 2022-12-28 ENCOUNTER — Other Ambulatory Visit: Payer: Self-pay

## 2023-01-11 ENCOUNTER — Other Ambulatory Visit: Payer: Self-pay | Admitting: Internal Medicine

## 2023-01-13 LAB — FECAL OCCULT BLOOD, IMMUNOCHEMICAL: Fecal Occult Bld: NEGATIVE

## 2023-03-07 ENCOUNTER — Encounter: Payer: Self-pay | Admitting: Nurse Practitioner

## 2023-03-07 ENCOUNTER — Other Ambulatory Visit: Payer: Self-pay

## 2023-03-07 ENCOUNTER — Ambulatory Visit: Payer: Self-pay | Attending: Nurse Practitioner | Admitting: Nurse Practitioner

## 2023-03-07 VITALS — BP 120/79 | HR 80 | Ht 66.0 in | Wt 267.0 lb

## 2023-03-07 DIAGNOSIS — I10 Essential (primary) hypertension: Secondary | ICD-10-CM

## 2023-03-07 DIAGNOSIS — R202 Paresthesia of skin: Secondary | ICD-10-CM

## 2023-03-07 MED ORDER — GABAPENTIN 100 MG PO CAPS
100.0000 mg | ORAL_CAPSULE | Freq: Every day | ORAL | 1 refills | Status: AC
Start: 2023-03-07 — End: ?
  Filled 2023-03-07: qty 30, 30d supply, fill #0
  Filled 2023-11-07: qty 30, 30d supply, fill #1

## 2023-03-07 MED ORDER — HYDROCHLOROTHIAZIDE 25 MG PO TABS
25.0000 mg | ORAL_TABLET | Freq: Every day | ORAL | 1 refills | Status: DC
Start: 2023-03-07 — End: 2023-09-09
  Filled 2023-03-07: qty 90, 90d supply, fill #0
  Filled 2023-08-25: qty 90, 90d supply, fill #1

## 2023-03-07 NOTE — Patient Instructions (Signed)
03-09-2018 Kindly inform the patient that postcontrast MRI scan of the brain showed no enhancing lesions. No new or worrisome finding

## 2023-03-07 NOTE — Progress Notes (Signed)
Assessment & Plan:  Kathryn Barrett was seen today for medical management of chronic issues.  Diagnoses and all orders for this visit:  Primary hypertension -     hydrochlorothiazide (HYDRODIURIL) 25 MG tablet; Take 1 tablet (25 mg total) by mouth daily. Continue all antihypertensives as prescribed.  Reminded to bring in blood pressure log for follow  up appointment.  RECOMMENDATIONS: DASH/Mediterranean Diets are healthier choices for HTN.    Paresthesia -     gabapentin (NEURONTIN) 100 MG capsule; Take 1 capsule (100 mg total) by mouth at bedtime. -     Iron, TIBC and Ferritin Panel -     CBC with Differential -     CMP14+EGFR    Patient has been counseled on age-appropriate routine health concerns for screening and prevention. These are reviewed and up-to-date. Referrals have been placed accordingly. Immunizations are up-to-date or declined.    Subjective:   Chief Complaint  Patient presents with   Medical Management of Chronic Issues   HPI Kathryn Barrett 52 y.o. female presents to office today for follow up to HTN  HTN  Blood pressure is well controlled today. She is taking hydrochlorothiazide 25 mg daily as prescribed.  BP Readings from Last 3 Encounters:  03/07/23 120/79  09/29/22 (!) 144/84  07/13/22 130/82    She is requesting a repeat f/u MRI from previous "lesions" seen on MRI w/o contrast several years ago.Instructed her to follow up with neurology for this. She denies any new neurological symptoms.      Review of Systems  Constitutional:  Negative for fever, malaise/fatigue and weight loss.  HENT: Negative.  Negative for nosebleeds.   Eyes: Negative.  Negative for blurred vision, double vision and photophobia.  Respiratory: Negative.  Negative for cough and shortness of breath.   Cardiovascular: Negative.  Negative for chest pain, palpitations and leg swelling.  Gastrointestinal: Negative.  Negative for heartburn, nausea and vomiting.  Musculoskeletal: Negative.   Negative for myalgias.  Neurological:  Positive for sensory change. Negative for dizziness, tingling, focal weakness, seizures and headaches.  Psychiatric/Behavioral: Negative.  Negative for suicidal ideas.     Past Medical History:  Diagnosis Date   Hypertension     History reviewed. No pertinent surgical history.  Family History  Problem Relation Age of Onset   Breast cancer Mother        late 58's   Diabetes Maternal Grandmother    Diabetes Maternal Grandfather    Diabetes Paternal Grandmother    Diabetes Paternal Grandfather     Social History Reviewed with no changes to be made today.   Outpatient Medications Prior to Visit  Medication Sig Dispense Refill   ferrous sulfate (FEROSUL) 325 (65 FE) MG tablet Take 1 tablet (325 mg total) by mouth 2 (two) times daily with a meal. 120 tablet 2   polyethylene glycol (MIRALAX) 17 g packet Take 17 g by mouth daily as needed. 30 each 3   Vitamin D, Ergocalciferol, (DRISDOL) 1.25 MG (50000 UNIT) CAPS capsule Take 1 capsule (50,000 Units total) by mouth every 7 (seven) days. (Patient taking differently: Take 50,000 Units by mouth every 7 (seven) days. gummies) 4 capsule 2   gabapentin (NEURONTIN) 100 MG capsule Take 1 capsule (100 mg total) by mouth at bedtime. 30 capsule 1   hydrochlorothiazide (HYDRODIURIL) 25 MG tablet Take 1 tablet (25 mg total) by mouth daily. 90 tablet 0   No facility-administered medications prior to visit.    Allergies  Allergen Reactions   Lisinopril  Swelling    Edema in hands and feet   Norvasc [Amlodipine] Swelling       Objective:    BP 120/79   Pulse 80   Ht 5\' 6"  (1.676 m)   Wt 267 lb (121.1 kg)   BMI 43.09 kg/m  Wt Readings from Last 3 Encounters:  03/07/23 267 lb (121.1 kg)  09/29/22 263 lb (119.3 kg)  07/13/22 253 lb (114.8 kg)    Physical Exam Vitals and nursing note reviewed.  Constitutional:      Appearance: She is well-developed.  HENT:     Head: Normocephalic and atraumatic.   Cardiovascular:     Rate and Rhythm: Normal rate and regular rhythm.     Heart sounds: Normal heart sounds. No murmur heard.    No friction rub. No gallop.  Pulmonary:     Effort: Pulmonary effort is normal. No tachypnea or respiratory distress.     Breath sounds: Normal breath sounds. No decreased breath sounds, wheezing, rhonchi or rales.  Chest:     Chest wall: No tenderness.  Abdominal:     General: Bowel sounds are normal.     Palpations: Abdomen is soft.  Musculoskeletal:        General: Normal range of motion.     Cervical back: Normal range of motion.  Skin:    General: Skin is warm and dry.  Neurological:     Mental Status: She is alert and oriented to person, place, and time.     Coordination: Coordination normal.  Psychiatric:        Behavior: Behavior normal. Behavior is cooperative.        Thought Content: Thought content normal.        Judgment: Judgment normal.          Patient has been counseled extensively about nutrition and exercise as well as the importance of adherence with medications and regular follow-up. The patient was given clear instructions to go to ER or return to medical center if symptoms don't improve, worsen or new problems develop. The patient verbalized understanding.   Follow-up: No follow-ups on file.   Claiborne Rigg, FNP-BC [facility] [place], [place] [phone]   03/07/2023, 3:18 PM

## 2023-03-09 ENCOUNTER — Ambulatory Visit: Payer: BLUE CROSS/BLUE SHIELD

## 2023-03-10 ENCOUNTER — Other Ambulatory Visit: Payer: Self-pay

## 2023-03-14 LAB — HM MAMMOGRAPHY: HM Mammogram: NORMAL (ref 0–4)

## 2023-03-16 ENCOUNTER — Telehealth: Payer: Self-pay | Admitting: Internal Medicine

## 2023-03-16 NOTE — Telephone Encounter (Signed)
Called & spoke to the patient. Verified name & DOB. Informed of results. Patient expressed verbal understanding.  Information has been abstracted into chart.

## 2023-03-16 NOTE — Telephone Encounter (Signed)
Let patient know that I received the results of her mammogram done 03/14/2023 through [facility].  The exam was normal.  Due again in 1 year.  Please update date patient's health maintenance with this information.  Copy will be sent to scanned into chart.

## 2023-03-17 ENCOUNTER — Ambulatory Visit: Payer: BLUE CROSS/BLUE SHIELD

## 2023-05-06 ENCOUNTER — Encounter (HOSPITAL_BASED_OUTPATIENT_CLINIC_OR_DEPARTMENT_OTHER): Payer: Self-pay | Admitting: Emergency Medicine

## 2023-05-06 ENCOUNTER — Other Ambulatory Visit: Payer: Self-pay

## 2023-05-06 ENCOUNTER — Emergency Department (HOSPITAL_BASED_OUTPATIENT_CLINIC_OR_DEPARTMENT_OTHER)
Admission: EM | Admit: 2023-05-06 | Discharge: 2023-05-06 | Disposition: A | Discharge status: Home / Self Care | Payer: Self-pay | Attending: Emergency Medicine | Admitting: Emergency Medicine

## 2023-05-06 DIAGNOSIS — M79644 Pain in right finger(s): Secondary | ICD-10-CM | POA: Insufficient documentation

## 2023-05-06 DIAGNOSIS — M654 Radial styloid tenosynovitis [de Quervain]: Secondary | ICD-10-CM

## 2023-05-06 NOTE — ED Provider Notes (Signed)
[place] [facility] Provider Note   CSN: [id number] Arrival date & time: 05/06/23  2225     History  Chief Complaint  Patient presents with   Hand Problem    Kathryn Barrett is a 52 y.o. female.  52 year old female who is right-handed who presents emergency department with right thumb and wrist pain and swelling.  Patient reports that over the past 3 weeks has had pain and swelling of her right thumb and wrist.  Says that it radiates up the radial aspect of her forearm.  No trauma to the area.  No new neck pain.  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  No numbness or weakness of the hand.  No surgeries.       Home Medications Prior to Admission medications   Medication Sig Start Date End Date Taking? Authorizing Provider  ferrous sulfate (FEROSUL) 325 (65 FE) MG tablet Take 1 tablet (325 mg total) by mouth 2 (two) times daily with a meal. 05/28/22   Marcine Matar, MD  gabapentin (NEURONTIN) 100 MG capsule Take 1 capsule (100 mg total) by mouth at bedtime. 03/07/23   Claiborne Rigg, NP  hydrochlorothiazide (HYDRODIURIL) 25 MG tablet Take 1 tablet (25 mg total) by mouth daily. 03/07/23   Claiborne Rigg, NP  polyethylene glycol (MIRALAX) 17 g packet Take 17 g by mouth daily as needed. 02/05/21   Marcine Matar, MD  Vitamin D, Ergocalciferol, (DRISDOL) 1.25 MG (50000 UNIT) CAPS capsule Take 1 capsule (50,000 Units total) by mouth every 7 (seven) days. Patient taking differently: Take 50,000 Units by mouth every 7 (seven) days. gummies 06/23/21   Mayers, Cari S, PA-C      Allergies    Lisinopril and Norvasc [amlodipine]    Review of Systems   Review of Systems  Physical Exam Updated Vital Signs BP (!) 142/83 (BP Location: Left Arm)   Pulse 84   Temp 98 F (36.7 C)   Resp 18   Ht 5\' 4"  (1.626 m)   Wt 113.4 kg   LMP 04/24/2023 (Approximate)   SpO2 99%   BMI 42.91  kg/m  Physical Exam Constitutional:      Appearance: Normal appearance.  Musculoskeletal:     Comments: Symmetrically palpable radial and ulnar pulses. Capillary refill <2 seconds to all digits.  Intact sensation to light touch of the radial, median and ulnar nerves demonstrated by testing in the dorsal web space of the thumb, the hypothenar eminence of the palm, and the radial aspect of the dorsum of the hand.  Intact motor function of the radial, median and ulnar nerves demonstrated by strength of hand grip, and spreading of the 2nd through 5th digits, thumb apposition, and ability to make "OK sign".  No snuffbox or other bony TTP of upper extremity.   Positive Finkelstein test on the right.  No wrist effusion.  No significant edema or swelling of the hands noted.  See below for image.  Neurological:     Mental Status: She is alert.     ED Results / Procedures / Treatments   Labs (all labs ordered are listed, but only abnormal results are displayed) Labs Reviewed - No data to display  EKG None  Radiology No results found.  Procedures .Ortho Injury Treatment  Date/Time: 05/06/2023 10:53 PM  Performed by: Rondel Baton, MD Authorized by: Rondel Baton, MD  Consent:    Consent obtained:  Verbal   Consent given by:  Patient   Alternatives discussed:  No treatmentInjury location: wrist Location details: right wrist Injury type: soft tissue Pre-procedure neurovascular assessment: neurovascularly intact Immobilization: splint Splint type: thumb spica Splint Applied by: ED Nurse Supplies used: elastic bandage Post-procedure neurovascular assessment: post-procedure neurovascularly intact       Medications Ordered in ED Medications - No data to display  ED Course/ Medical Decision Making/ A&P                                 Medical Decision Making  Yeng Frankie is a 52 y.o. female who is right-handed and presents to the emergency department with  right thumb and arm pain  Initial Ddx:  DeQuervain tenosynovitis, fracture, arthritis, gout, DVT  MDM/Course:  Patient presents to the emergency department with pain and swelling of her right thumb that has been atraumatic.  On exam no significant swelling of the upper extremity to suggest DVT.  Based on the distribution of her symptoms and her positive Lourena Simmonds test suspect that she may have de Quervain's tenosynovitis which has been exacerbated by her work where she does lots of lifting.  No trauma that would suggest a fracture.  No significant effusion that would suggest an inflammatory arthritis or gout.  Patient placed in a Velcro thumb spica splint and instructed to follow-up with the hand doctors in several weeks to see if her symptoms are improved.  This patient presents to the ED for concern of complaints listed in HPI, this involves an extensive number of treatment options, and is a complaint that carries with it a high risk of complications and morbidity. Disposition including potential need for admission considered.   Dispo: DC Home. Return precautions discussed including, but not limited to, those listed in the AVS. Allowed pt time to ask questions which were answered fully prior to dc.  Records reviewed Outpatient Clinic Notes I personally reviewed and interpreted the pt's EKG: see above for interpretation  I have reviewed the patients home medications and made adjustments as needed  Portions of this note were generated with Dragon dictation software. Dictation errors may occur despite best attempts at proofreading.           Final Clinical Impression(s) / ED Diagnoses Final diagnoses:  De Quervain's tenosynovitis, right    Rx / DC Orders ED Discharge Orders     None         Rondel Baton, MD 05/07/23 1039

## 2023-05-06 NOTE — Discharge Instructions (Signed)
Wear the splint for your wrist pain.  Take it off daily to stretch your wrist.  Continue taking Tylenol and ibuprofen and icing your wrist.  Follow-up with the hand doctors in 2 weeks if your symptoms do not improve.

## 2023-05-06 NOTE — ED Triage Notes (Signed)
Pt reports swelling around the thumb and wrist of the right hand that started 2 -3 weeks ago, no change in symptoms over that time, dneies numbness or tingling

## 2023-08-03 ENCOUNTER — Ambulatory Visit: Payer: Self-pay

## 2023-08-03 NOTE — Telephone Encounter (Signed)
 Chief Complaint: Skin Lump  Symptoms: Skin lump behind left ear, draining pus, warm to touch,  Frequency: constant  Pertinent Negatives: Patient denies fever, pain, foul odor Disposition: [] ED /[] Urgent Care (no appt availability in office) / [x] Appointment(In office/virtual)/ []  [place] [facility]/ [] Home Care/ [] Refused Recommended Disposition /[]  Mobile Bus/ []  Follow-up with PCP Additional Notes: Patient states she noticed a small lump behind her left ear about 1 week ago and it is the size of a pea. Patient states she could feel it and thought it was a pimple. Patient took a picture of it yesterday and notice 3 small holes that were draining pus. Patient states it only hurts when she touches it too much. Care advice was given and patient has been scheduled for the first available appointment in the office 08/11/23. Patient stated she would try the home care advice and if the symptoms improve she will call to cancel the appointment.  Summary: Bump behind ear, drainage   The patient called in stating she has a bump behind her left ear and states there has been some drainage coming out of it. She says if she touches it it bothers her but otherwise she is ok. Please assist patient further     Reason for Disposition  Looks like a boil, infected sore, deep ulcer or other infected rash  Answer Assessment - Initial Assessment Questions 1. APPEARANCE of SWELLING: What does it look like?     Its like a pimple  2. SIZE: How large is the swelling? (e.g., inches, cm; or compare to size of pinhead, tip of pen, eraser, coin, pea, grape, ping pong ball)      Pea 3. LOCATION: Where is the swelling located?     Behind the left ear  4. ONSET: When did the swelling start?     1 week  5. COLOR: What color is it? Is there more than one color?     Reddish pink  6. PAIN: Is there any pain? If Yes, ask: How bad is the pain? (e.g., scale 1-10; or mild, moderate, severe)     - NONE  (0): no pain   - MILD (1-3): doesn't interfere with normal activities    - MODERATE (4-7): interferes with normal activities or awakens from sleep    - SEVERE (8-10): excruciating pain, unable to do any normal activities     Moderate 7. ITCH: Does it itch? If Yes, ask: How bad is the itch?      No  8. CAUSE: What do you think caused the swelling? Look like a pimple  9 OTHER SYMPTOMS: Do you have any other symptoms? (e.g., fever)     Draining pus, warm to touch  Protocols used: Skin Lump or Localized Swelling-A-AH

## 2023-08-09 ENCOUNTER — Other Ambulatory Visit: Payer: Self-pay

## 2023-08-11 ENCOUNTER — Ambulatory Visit: Payer: Self-pay | Admitting: Physician Assistant

## 2023-08-26 ENCOUNTER — Other Ambulatory Visit: Payer: Self-pay

## 2023-09-05 ENCOUNTER — Other Ambulatory Visit: Payer: Self-pay

## 2023-09-09 ENCOUNTER — Encounter: Payer: Self-pay | Admitting: Internal Medicine

## 2023-09-09 ENCOUNTER — Ambulatory Visit: Payer: No Typology Code available for payment source | Attending: Internal Medicine | Admitting: Internal Medicine

## 2023-09-09 ENCOUNTER — Other Ambulatory Visit: Payer: Self-pay

## 2023-09-09 VITALS — BP 131/79 | HR 89 | Temp 98.2°F | Ht 64.0 in | Wt 262.0 lb

## 2023-09-09 DIAGNOSIS — R7303 Prediabetes: Secondary | ICD-10-CM | POA: Diagnosis not present

## 2023-09-09 DIAGNOSIS — D500 Iron deficiency anemia secondary to blood loss (chronic): Secondary | ICD-10-CM

## 2023-09-09 DIAGNOSIS — N938 Other specified abnormal uterine and vaginal bleeding: Secondary | ICD-10-CM

## 2023-09-09 DIAGNOSIS — M654 Radial styloid tenosynovitis [de Quervain]: Secondary | ICD-10-CM

## 2023-09-09 DIAGNOSIS — I10 Essential (primary) hypertension: Secondary | ICD-10-CM | POA: Diagnosis not present

## 2023-09-09 DIAGNOSIS — Z6841 Body Mass Index (BMI) 40.0 and over, adult: Secondary | ICD-10-CM

## 2023-09-09 DIAGNOSIS — Z2821 Immunization not carried out because of patient refusal: Secondary | ICD-10-CM

## 2023-09-09 DIAGNOSIS — E66813 Obesity, class 3: Secondary | ICD-10-CM | POA: Diagnosis not present

## 2023-09-09 LAB — POCT GLYCOSYLATED HEMOGLOBIN (HGB A1C): HbA1c, POC (prediabetic range): 5.8 % (ref 5.7–6.4)

## 2023-09-09 LAB — GLUCOSE, POCT (MANUAL RESULT ENTRY): POC Glucose: 94 mg/dL (ref 70–99)

## 2023-09-09 MED ORDER — HYDROCHLOROTHIAZIDE 25 MG PO TABS
25.0000 mg | ORAL_TABLET | Freq: Every day | ORAL | 1 refills | Status: DC
  Filled 2023-09-09 – 2024-01-08 (×2): qty 90, 90d supply, fill #0

## 2023-09-09 NOTE — Progress Notes (Signed)
Patient ID: Kathryn Barrett, female    DOB: 1970-12-09  MRN: [id number]  CC: Hypertension (HTN & prediabetes f/u./Concern about inflammation of R hand/No to all vax)   Subjective: Kathryn Barrett is a 53 y.o. female who presents for chronic ds management. Her concerns today include:  Pt with hx of HTN, obesity and anemia due to menorrhagia, PreDM.     HYPERTENSION Currently taking: see medication list, hydrochlorothiazide, did not take as yet for the a.m Med Adherence: [x]  Yes    []  No Medication side effects: []  Yes    [x]  No Adherence with salt restriction: [x]  Yes    []  No Home Monitoring?: [x]  Yes    []  No Monitoring Frequency: QOD Home BP results range: 120s/60-88 SOB? []  Yes    [x]  No Chest Pain?: []  Yes    [x]  No Leg swelling?: []  Yes    [x]  No  IDA:  taking Iron supplement once a day not BID as written on med list. Iron studies had improved slightly when last checked 02/2023  Last Hb 12 up from 10.5. Still gets menses; reports irregular and persistent bleeding for the last 4 mths.  Varies from spotting for about 2 weeks then regular to heavy bleeding for 2 to 3 weeks.  Sometimes passing clots.  Bleeding ended about 1 week ago.    C/o issue with RT hand and wrist; she points to the area involving the thumb, thenar eminence and radial part of the wrist.. Pain present for a while. Some swelling. Seen in ER 04/2023 for same.  Diagnosed with De Quervain's tenosynovitis.  Given a splint which she wore for 3 weeks.  Was taking ibuprofen 3 times a day.  Issue still present. Hurts to write.  PreDM/Obesity:  Results for orders placed or performed in visit on 09/09/23  POCT glycosylated hemoglobin (Hb A1C)   Collection Time: 09/09/23  8:51 AM  Result Value Ref Range   Hemoglobin A1C     HbA1c POC (<> result, manual entry)     HbA1c, POC (prediabetic range) 5.8 5.7 - 6.4 %   HbA1c, POC (controlled diabetic range)    POCT glucose (manual entry)   Collection Time: 09/09/23  8:51 AM   Result Value Ref Range   POC Glucose 94 70 - 99 mg/dl  Does a lot of walking; works in group homes Does a lot of smoothie, eating smaller portions and salads.  Not much red meats.  Admits to drinking sweet tea and sodas.   HM: declines flu and shingles vaccine. Patient Active Problem List   Diagnosis Date Noted   Numbness of right foot 06/22/2021   Numbness and tingling of left hand 06/22/2021   Stress 06/22/2021   Hot flushes, perimenopausal 06/22/2021   Class 3 severe obesity due to excess calories with serious comorbidity and body mass index (BMI) of 40.0 to 44.9 in adult [facility]) 07/07/2017   Iron deficiency anemia due to chronic blood loss 11/08/2016   Prediabetes 01/01/2014   Essential hypertension 01/01/2014   Menorrhagia 01/01/2014   Breast lump on left side at 6 o'clock position 07/31/2013     Current Outpatient Medications on File Prior to Visit  Medication Sig Dispense Refill   ferrous sulfate (FEROSUL) 325 (65 FE) MG tablet Take 1 tablet (325 mg total) by mouth 2 (two) times daily with a meal. 120 tablet 2   gabapentin (NEURONTIN) 100 MG capsule Take 1 capsule (100 mg total) by mouth at bedtime. 30 capsule 1  polyethylene glycol (MIRALAX) 17 g packet Take 17 g by mouth daily as needed. 30 each 3   Vitamin D, Ergocalciferol, (DRISDOL) 1.25 MG (50000 UNIT) CAPS capsule Take 1 capsule (50,000 Units total) by mouth every 7 (seven) days. 4 capsule 2   No current facility-administered medications on file prior to visit.    Allergies  Allergen Reactions   Lisinopril Swelling    Edema in hands and feet   Norvasc [Amlodipine] Swelling    Social History   Socioeconomic History   Marital status: Single    Spouse name: Not on file   Number of children: Not on file   Years of education: Not on file   Highest education level: Not on file  Occupational History   Not on file  Tobacco Use   Smoking status: Never   Smokeless tobacco: Never  Vaping Use   Vaping status:  Never Used  Substance and Sexual Activity   Alcohol use: No    Alcohol/week: 0.0 standard drinks of alcohol   Drug use: No   Sexual activity: Yes    Partners: Male    Birth control/protection: None  Other Topics Concern   Not on file  Social History Narrative   Are you right handed or left handed? right   Are you currently employed ? 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    What type of home do you live in: 1 story or 2 story? one    Caffeine none   Social Drivers of Corporate investment banker Strain: Low Risk  (09/09/2023)   Overall Financial Resource Strain (CARDIA)    Difficulty of Paying Living Expenses: Not hard at all  Food Insecurity: No Food Insecurity (09/09/2023)   Hunger Vital Sign    Worried About Running Out of Food in the Last Year: Never true    Ran Out of Food in the Last Year: Never true  Transportation Needs: No Transportation Needs (09/09/2023)   PRAPARE - Administrator, Civil Service (Medical): No    Lack of Transportation (Non-Medical): No  Physical Activity: Inactive (09/09/2023)   Exercise Vital Sign    Days of Exercise per Week: 0 days    Minutes of Exercise per Session: 0 min  Stress: No Stress Concern Present (09/09/2023)   [employer] of Occupational Health - Occupational Stress Questionnaire    Feeling of Stress : Not at all  Social Connections: Moderately Integrated (09/09/2023)   Social Connection and Isolation Panel [NHANES]    Frequency of Communication with Friends and Family: More than three times a week    Frequency of Social Gatherings with Friends and Family: More than three times a week    Attends Religious Services: More than 4 times per year    Active Member of [employer] or Organizations: Yes    Attends Banker Meetings: 1 to 4 times per year    Marital Status: Never married  Intimate Partner Violence: Not At Risk  (09/09/2023)   Humiliation, Afraid, Rape, and Kick questionnaire    Fear of Current or Ex-Partner: No    Emotionally Abused: No    Physically Abused: No    Sexually Abused: No    Family History  Problem Relation Age of Onset   Breast cancer Mother        late 75's   Diabetes Maternal Grandmother  Diabetes Maternal Grandfather    Diabetes Paternal Grandmother    Diabetes Paternal Grandfather     No past surgical history on file.  ROS: Review of Systems Negative except as stated above  PHYSICAL EXAM: BP 131/79 (BP Location: Left Arm, Patient Position: Sitting, Cuff Size: Large)   Pulse 89   Temp 98.2 F (36.8 C) (Oral)   Ht 5\' 4"  (1.626 m)   Wt 262 lb (118.8 kg)   SpO2 100%   BMI 44.97 kg/m   Wt Readings from Last 3 Encounters:  09/09/23 262 lb (118.8 kg)  05/06/23 250 lb (113.4 kg)  03/07/23 267 lb (121.1 kg)    Physical Exam  General appearance - alert, well appearing, middle age obese AAF and in no distress Mental status - normal mood, behavior, speech, dress, motor activity, and thought processes Neck - supple, no significant adenopathy Chest - clear to auscultation, no wheezes, rales or rhonchi, symmetric air entry Heart - normal rate, regular rhythm, normal S1, S2, no murmurs, rubs, clicks or gallops Musculoskeletal -right hand: Mild swelling of the wrist.  Good range of motion of the wrist and the fingers.  Grip 4/5 on the right, normal on the left.  Mild tenderness on palpation and movement of the right thumb Extremities - no LE edema      Latest Ref Rng & Units 03/07/2023    3:36 PM 12/25/2021    3:33 PM 02/05/2021   12:13 PM  CMP  Glucose 70 - 99 mg/dL 87  90  92   BUN 6 - 24 mg/dL 11  10  11    Creatinine 0.57 - 1.00 mg/dL 4.09  8.11  9.14   Sodium 134 - 144 mmol/L 140  140  141   Potassium 3.5 - 5.2 mmol/L 3.6  3.6  4.6   Chloride 96 - 106 mmol/L 101  100  102   CO2 20 - 29 mmol/L 26  26  23    Calcium 8.7 - 10.2 mg/dL 9.9  78.2  9.2   Total  Protein 6.0 - 8.5 g/dL 7.4   7.4   Total Bilirubin 0.0 - 1.2 mg/dL 0.3   0.3   Alkaline Phos 44 - 121 IU/L 134   102   AST 0 - 40 IU/L 13   10   ALT 0 - 32 IU/L 12   11    Lipid Panel     Component Value Date/Time   CHOL 125 02/05/2021 1213   TRIG 47 02/05/2021 1213   HDL 40 02/05/2021 1213   CHOLHDL 3.1 02/05/2021 1213   CHOLHDL 3.1 11/02/2013 1026   VLDL 10 11/02/2013 1026   LDLCALC 74 02/05/2021 1213    CBC    Component Value Date/Time   WBC 6.8 03/07/2023 1536   WBC 9.9 11/08/2016 0441   RBC 4.54 03/07/2023 1536   RBC 4.97 11/08/2016 0441   HGB 12.3 03/07/2023 1536   HCT 37.7 03/07/2023 1536   PLT 268 03/07/2023 1536   MCV 83 03/07/2023 1536   MCH 27.1 03/07/2023 1536   MCH 18.3 (L) 11/08/2016 0441   MCHC 32.6 03/07/2023 1536   MCHC 30.7 11/08/2016 0441   RDW 14.3 03/07/2023 1536   LYMPHSABS 2.1 03/07/2023 1536   MONOABS 0.7 01/31/2015 1215   EOSABS 0.3 03/07/2023 1536   BASOSABS 0.1 03/07/2023 1536    ASSESSMENT AND PLAN: 1. Primary hypertension (Primary) Close to goal.  She has not taken HCTZ as yet for the morning.  She will  do so when she returns home. - hydrochlorothiazide (HYDRODIURIL) 25 MG tablet; Take 1 tablet (25 mg total) by mouth daily.  Dispense: 90 tablet; Refill: 1  2. Prediabetes Patient advised to eliminate sugary drinks from the diet, cut back on portion sizes especially of white carbohydrates, eat more white lean meat like chicken [place] and seafood instead of beef or pork and incorporate fresh fruits and vegetables into the diet daily. Encouraged her to get in some exercise outside of work with goal of about 150 minutes/week total of moderate intensity exercise. - POCT glycosylated hemoglobin (Hb A1C) - POCT glucose (manual entry)  3. Class 3 severe obesity due to excess calories with serious comorbidity and body mass index (BMI) of 40.0 to 44.9 in adult [facility]) See #2 above - Amb ref to Medical Nutrition Therapy-MNT  4. De Quervain's  tenosynovitis, right Recommend purchasing some Voltaren gel over-the-counter and using as needed.  Will refer to Ortho since this has not improved with use of wrist splint and ibuprofen - AMB referral to orthopedics  5. Dysfunctional uterine bleeding Differential diagnosis include being perimenopausal versus uterine pathology.  I recommend pelvic ultrasound to assess the uterine lining and referral to gynecology.  I explained the reasoning for the pelvic ultrasound but patient declined for me to order it at this time stating she will think about it. - Ambulatory referral to Gynecology - FSH/LH  6. Iron deficiency anemia due to chronic blood loss Continue taking iron supplement once a day. - CBC - Iron, TIBC and Ferritin Panel  7. Influenza vaccination declined  8. Herpes zoster vaccination declined   Patient was given the opportunity to ask questions.  Patient verbalized understanding of the plan and was able to repeat key elements of the plan.   This documentation was completed using Paediatric nurse.  Any transcriptional errors are unintentional.  Orders Placed This Encounter  Procedures   CBC   Iron, TIBC and Ferritin Panel   FSH/LH   Ambulatory referral to Gynecology   Amb ref to Medical Nutrition Therapy-MNT   AMB referral to orthopedics   POCT glycosylated hemoglobin (Hb A1C)   POCT glucose (manual entry)     Requested Prescriptions   Signed Prescriptions Disp Refills   hydrochlorothiazide (HYDRODIURIL) 25 MG tablet 90 tablet 1    Sig: Take 1 tablet (25 mg total) by mouth daily.    Return in about 4 months (around 01/07/2024).  Jonah Blue, MD, FACP

## 2023-09-09 NOTE — Patient Instructions (Signed)
I have referred you to the gynecologist for further evaluation of the abnormal pattern of vaginal bleeding that you have been having.  You will be called with this appointment.  If you change your mind about having the pelvic ultrasound done, please let me know so that I can submit the order.  Purchase Voltaren gel over-the-counter and use as needed on your right hand/wrist.  You have been referred to orthopedics.  Prediabetes: Eating Plan Prediabetes is when your levels of blood sugar, also called glucose, are higher than normal. This can put you at risk for getting type 2 diabetes. When you have prediabetes, making healthy changes can help keep you from getting diabetes. This includes changes in your diet. Work with your health care provider or an expert in healthy eating called a dietitian. They can help you create a healthy eating plan. This plan can help you: Control your blood sugar levels. Improve your cholesterol levels. Manage your blood pressure. What are tips for following this plan? Reading food labels Read food labels to check the amount of fat and sugar in prepackaged foods. Avoid foods that have: Saturated fats. Trans fats. Added sugars. Check food labels for the amount of salt (sodium). Avoid foods that have more than 300 milligrams (mg) of salt per serving. Limit your salt intake to less than 2,300 mg each day. Shopping Avoid buying pre-made and processed foods. Avoid buying drinks with added sugar. Cooking Cook with olive oil. Do not use: Butter. Lard. Ghee. Bake, broil, grill, steam, or boil foods. Avoid frying. Meal planning  Work with your dietitian to create an eating plan that's right for you. This may include tracking how many calories you take in each day. Use a food diary, notebook, or mobile app to track what you eat at each meal. Consider following a Mediterranean diet. This includes: Eating many servings of fresh fruits and vegetables each day. Eating  fish at least twice a week. Eating one serving each day of whole grains, beans, nuts, and seeds. Using olive oil instead of other fats. Limiting alcohol. Limiting red meat. Using nonfat or low-fat dairy products. Consider following a plant-based diet. This means eating mostly: Vegetables and fruit. Grains. Beans. Nuts and seeds. If you have high blood pressure, you may need to limit your salt intake or follow a diet called the DASH eating plan. The DASH eating plan can help lower high blood pressure. Lifestyle Set weight loss goals with help from your health care team. Losing 7% of your body weight is a good goal for most people with prediabetes. Exercise for at least 30 minutes, 5 or more days a week. For support, think about joining a support group or talking with a mental health counselor. Take medicines only as told. What foods are recommended? Fruits Berries. Bananas. Apples. Oranges. Grapes. Papaya. Mango. Pomegranate. Kiwi. Grapefruit. Cherries. Vegetables Lettuce. Spinach. Peas. Beets. Cauliflower. Cabbage. Broccoli. Carrots. Tomatoes. Squash. Eggplant. Herbs. Peppers. Onions. Cucumbers. Brussels sprouts. Grains Whole grains, such as whole-wheat or whole-grain breads or pasta. Unsweetened oatmeal. Bulgur. Barley. Quinoa. Brown rice. Corn or whole-wheat flour tortillas or taco shells. Meats and other proteins Seafood. Poultry without skin. Lean cuts of pork and beef. Tofu. Eggs. Nuts. Beans. Dairy Low-fat or fat-free dairy products, such as yogurt, cottage cheese, and cheese. Beverages Water. Tea. Coffee. Sugar-free or diet soda. Seltzer water. Low-fat or nonfat milk. Milk alternatives, such as soy or almond milk. Fats and oils Olive oil. Canola oil. Sunflower oil. Grapeseed oil. Avocado. Walnuts. Sweets  and desserts Sugar-free or low-fat pudding. Sugar-free or low-fat ice cream and other frozen treats. Seasonings and condiments Herbs. Salt-free spices. Mustard. Relish.  Low-salt, low-sugar ketchup. Low-salt, low-sugar barbecue sauce. Low-fat or fat-free mayonnaise. The items listed above may not be all the foods and drinks you can have. Talk with a dietitian to learn more. What foods are not recommended? Fruits Fruits canned with syrup. Vegetables Canned vegetables. Frozen vegetables with butter or cream sauce. Grains Refined white flour and flour products, such as bread, pasta, snack foods, and cereals. Meats and other proteins Fatty cuts of meat. Poultry with skin. Breaded or fried meat. Processed meats. Dairy Full-fat yogurt, cheese, or milk. Beverages Sweetened drinks, such as iced tea and soda. Fats and oils Butter. Lard. Ghee. Sweets and desserts Baked goods, such as cake, cupcakes, pastries, cookies, and cheesecake. Seasonings and condiments Spice mixes with added salt. Ketchup. Barbecue sauce. Mayonnaise. The items listed above may not be all the foods and drinks you should avoid. Talk with a dietitian to learn more. Where to find more information American Diabetes Association: diabetes.org/food-nutrition This information is not intended to replace advice given to you by your health care provider. Make sure you discuss any questions you have with your health care provider. Document Revised: 02/13/2023 Document Reviewed: 02/13/2023 Elsevier Patient Education  2024 [employer].

## 2023-09-10 LAB — IRON,TIBC AND FERRITIN PANEL
Ferritin: 22 ng/mL (ref 15–150)
Iron Saturation: 27 % (ref 15–55)
Iron: 109 ug/dL (ref 27–159)
Total Iron Binding Capacity: 399 ug/dL (ref 250–450)
UIBC: 290 ug/dL (ref 131–425)

## 2023-09-10 LAB — CBC
Hematocrit: 35.5 % (ref 34.0–46.6)
Hemoglobin: 10.9 g/dL — ABNORMAL LOW (ref 11.1–15.9)
MCH: 24.2 pg — ABNORMAL LOW (ref 26.6–33.0)
MCHC: 30.7 g/dL — ABNORMAL LOW (ref 31.5–35.7)
MCV: 79 fL (ref 79–97)
Platelets: 362 10*3/uL (ref 150–450)
RBC: 4.5 x10E6/uL (ref 3.77–5.28)
RDW: 14.5 % (ref 11.7–15.4)
WBC: 7.3 10*3/uL (ref 3.4–10.8)

## 2023-09-10 LAB — FSH/LH
FSH: 46.7 m[IU]/mL
LH: 35.8 m[IU]/mL

## 2023-11-08 ENCOUNTER — Other Ambulatory Visit: Payer: Self-pay

## 2023-11-08 ENCOUNTER — Ambulatory Visit: Payer: Self-pay

## 2023-11-08 NOTE — Telephone Encounter (Signed)
 Will forward to provider for recommendations

## 2023-11-08 NOTE — Telephone Encounter (Signed)
 Copied from CRM [id number]. Topic: Clinical - Red Word Triage >> Nov 08, 2023  9:25 AM Elle L wrote: Red Word that prompted transfer to Nurse Triage: The patient is having severe nerve pain in her left hip to her left knee. She sometimes has tightness in her right leg as well. She was seen by Dr. Lincoln Renshaw for this previously and was referred to a Neurologist who prescribed her Gabapentin but it is not helping and the pain is worsening.  Chief Complaint: leg pain  Symptoms: Pain Left Leg on side of thigh : going towards knee and hip  & right tingling around knee area Frequency: x 3 weeks Pertinent Negatives: Patient denies fever Disposition: [] ED /[] Urgent Care (no appt availability in office) / [x] Appointment(In office/virtual)/ []  [facility]/ [] Home Care/ [] Refused Recommended Disposition /[] [place] [facility]/ []  Follow-up with PCP Additional Notes: pt states pain has been ongoing for a while but last 3 weeks it has gotten worse.  Pt c/o sharp shooting 10/10 pain in left leg and tingling sensation in right.   Reason for Disposition  [1] MILD pain (e.g., does not interfere with normal activities) AND [2] present > 7 days  Answer Assessment - Initial Assessment Questions 1. ONSET: "When did the pain start?"      X 3 weeks 2. LOCATION: "Where is the pain located?"      Pain Left Leg on side of thigh : going towards knee and hip  - right tingling around knee area 3. PAIN: "How bad is the pain?"    (Scale 1-10; or mild, moderate, severe)   -  MILD (1-3): doesn't interfere with normal activities    -  MODERATE (4-7): interferes with normal activities (e.g., work or school) or awakens from sleep, limping    -  SEVERE (8-10): excruciating pain, unable to do any normal activities, unable to walk     10/10 4. WORK OR EXERCISE: "Has there been any recent work or exercise that involved this part of the body?"      no 5. CAUSE: "What do you think is causing the leg pain?"     Does  patient care and some patients are total care 6. OTHER SYMPTOMS: "Do you have any other symptoms?" (e.g., chest pain, back pain, breathing difficulty, swelling, rash, fever, numbness, weakness)     Swelling on left 7. PREGNANCY: "Is there any chance you are pregnant?" "When was your last menstrual period?"     N/a  Protocols used: Leg Pain-A-AH

## 2023-11-08 NOTE — Telephone Encounter (Signed)
 Needs appt

## 2023-11-10 NOTE — Telephone Encounter (Signed)
 Called but no answer. Please schedule next available appointment with any provider.

## 2023-11-14 ENCOUNTER — Telehealth (INDEPENDENT_AMBULATORY_CARE_PROVIDER_SITE_OTHER): Payer: Self-pay | Admitting: Primary Care

## 2023-11-14 NOTE — Telephone Encounter (Signed)
 Called but no answer. LVM to call back.  Patient has acute visit scheduled with [facility] on 11[facility]/20[facility] to address leg pain.

## 2023-11-14 NOTE — Telephone Encounter (Signed)
 Called pt about upcoming appt. Pt will be present.

## 2023-11-21 ENCOUNTER — Ambulatory Visit (INDEPENDENT_AMBULATORY_CARE_PROVIDER_SITE_OTHER): Payer: Self-pay | Admitting: Primary Care

## 2023-11-21 ENCOUNTER — Encounter (INDEPENDENT_AMBULATORY_CARE_PROVIDER_SITE_OTHER): Payer: Self-pay | Admitting: Primary Care

## 2023-11-21 VITALS — BP 134/83 | HR 85 | Resp 16 | Ht 66.0 in | Wt 263.2 lb

## 2023-11-21 DIAGNOSIS — M25561 Pain in right knee: Secondary | ICD-10-CM | POA: Diagnosis not present

## 2023-11-21 DIAGNOSIS — G8929 Other chronic pain: Secondary | ICD-10-CM

## 2023-11-21 NOTE — Patient Instructions (Signed)
 Chronic Knee Pain, Adult Knee pain that lasts longer than 3 months is called chronic knee pain. You may have pain in one or both knees. Symptoms of chronic knee pain may also include swelling and stiffness. Many conditions can cause chronic knee pain. The most common cause is wear and tear of your knee joint as you get older. Other possible causes include: A disease that causes inflammation of the knee, such as rheumatoid arthritis. This usually affects both knees. A condition called inflammatory arthritis, such as gout. An injury to the knee that causes arthritis. An injury to the knee that damages the ligaments. Ligaments are tissues that connect bones to each other. Runner's knee or pain behind the kneecap. Treatment for chronic knee pain depends on the cause. The main treatments for chronic knee pain are: Doing exercises to help your knee move better and get stronger, called physical therapy. Losing weight if you are overweight. This condition may also be treated with medicines, injections, a knee sleeve or brace, and by using crutches. You health care provider may also recommend rest, ice, pressure (compression), and elevation, also called RICE therapy. Follow these instructions at home: If you have a knee sleeve or brace that can be taken off:  Wear the knee sleeve or brace as told by your provider. Take it off only if your provider says that you can. Check the skin around it every day. Tell your provider if you see problems. Loosen the knee sleeve or brace if your toes tingle, are numb, or turn cold and blue. Keep the knee sleeve or brace clean and dry. Bathing If the knee sleeve or brace is not waterproof: Do not let it get wet. Cover it when you take a bath or shower. Use a cover that does not let any water in. Managing pain, stiffness, and swelling     If told, put heat on the area. Do this as often as told. Use the heat source that your provider recommends, such as a moist  heat pack or a heating pad. If you have a knee sleeve or brace that you can take off, remove it as told. Place a towel between your skin and the heat source. Leave the heat on for 20-30 minutes. If told, put ice on the area. If you have a knee sleeve or brace that you can take off, remove it as told. Put ice in a plastic bag. Place a towel between your skin and the bag. Leave the ice on for 20 minutes, 2-3 times a day. If your skin turns bright red, remove the ice or heat right away to prevent skin damage. The risk of damage is higher if you cannot feel pain, heat, or cold. Move your toes often to reduce stiffness and swelling. Raise the injured area above the level of your heart while you are sitting or lying down. Use a pillow to support your foot as needed. Activity Avoid activities where both feet leave the ground at the same time. Avoid running, jumping rope, and doing jumping jacks. Follow the exercise plan that your provider made for you. Your provider may suggest that you: Avoid activities that make knee pain worse. This may mean that you need to change your exercise routines, sports, or job duties. Wear shoes with cushioned soles. Avoid sports that require running and sudden changes in direction. Do physical therapy. Physical therapy helps your knee move better and get stronger. Exercise as told. Do exercises that increase balance and strength, such as  tai chi and yoga. Do not stand or walk on your injured knee until you're told it's okay. Use crutches as told. Return to normal activities when you're told. Ask what things are safe for you to do. General instructions Take your medicines only as told by your provider. If you are overweight, work with your provider and an expert in healthy eating called a dietitian to set goals to lose weight. Losing even a little weight can reduce knee pain. Being overweight can make your knee hurt more. Do not smoke, vape, or use products with  nicotine or tobacco in them. If you need help quitting, talk with your provider. Keep all follow-up visits. Your provider will monitor your pain and try other treatments if needed. Contact a health care provider if: You have knee pain that is not getting better or gets worse. You are not able to do your exercises due to knee pain. Get help right away if: Your knee swells and the swelling becomes worse. You cannot move your knee. You have severe knee pain. This information is not intended to replace advice given to you by your health care provider. Make sure you discuss any questions you have with your health care provider. Document Revised: 04/14/2023 Document Reviewed: 09/06/2022 Elsevier Patient Education  2024 [employer].

## 2023-11-21 NOTE — Progress Notes (Signed)
 [facility]  Kathryn Barrett, is a 53 y.o. female  HYQ:[id number]  XBM:[id number]  DOB - 1970/12/05  Chief complaint pain left lower extremity  Subjective:   Kathryn Barrett is a 53 y.o. female here today for an acute visit. Patient voices concern with pain starting at hip radiating down to left knee which started approximately 4-5 months ago.  There is swelling and pain. Knee Pain  There was no injury mechanism. The pain is present in the left knee and left thigh. The quality of the pain is described as stabbing. The pain is at a severity of 10/10. The pain is severe. The pain has been Fluctuating since onset. Associated symptoms include numbness and tingling. Associated symptoms comments: At night. The symptoms are aggravated by movement. She has tried NSAIDs, ice, acetaminophen  and heat (gabapentin ) for the symptoms. The treatment provided mild relief.    No problems updated.  Comprehensive ROS Pertinent positive and negative noted in HPI   Allergies  Allergen Reactions   Lisinopril  Swelling    Edema in hands and feet   Norvasc  [Amlodipine ] Swelling    Past Medical History:  Diagnosis Date   Hypertension     Current Outpatient Medications on File Prior to Visit  Medication Sig Dispense Refill   ferrous sulfate  (FEROSUL) 325 (65 FE) MG tablet Take 1 tablet (325 mg total) by mouth 2 (two) times daily with a meal. 120 tablet 2   gabapentin  (NEURONTIN ) 100 MG capsule Take 1 capsule (100 mg total) by mouth at bedtime. 30 capsule 1   hydrochlorothiazide  (HYDRODIURIL ) 25 MG tablet Take 1 tablet (25 mg total) by mouth daily. 90 tablet 1   polyethylene glycol (MIRALAX ) 17 g packet Take 17 g by mouth daily as needed. 30 each 3   Vitamin D , Ergocalciferol , (DRISDOL ) 1.25 MG (50000 UNIT) CAPS capsule Take 1 capsule (50,000 Units total) by mouth every 7 (seven) days. 4 capsule 2   No current facility-administered medications on file prior to visit.   Health  Maintenance  Topic Date Due   Hepatitis C Screening  Never done   Pap with HPV screening  Never done   COVID-19 Vaccine (2 - 2024-25 season) 03/27/2023   Zoster (Shingles) Vaccine (1 of 2) 12/07/2023*   Colon Cancer Screening  01/11/2024*   Stool Blood Test  01/11/2024   Flu Shot  02/24/2024   Mammogram  03/13/2025   HIV Screening  Completed   HPV Vaccine  Aged Out   Meningitis B Vaccine  Aged Out   DTaP/Tdap/Td vaccine  Discontinued  *Topic was postponed. The date shown is not the original due date.    Objective:  BP 134/83   Pulse 85   Resp 16   Ht 5\' 6"  (1.676 m)   Wt 263 lb 3.2 oz (119.4 kg)   SpO2 100%   BMI 42.48 kg/m    Physical Exam Constitutional:      Appearance: She is obese.     Comments: morbid  HENT:     Right Ear: Tympanic membrane and external ear normal.     Left Ear: Tympanic membrane and external ear normal.     Ears:     Comments: Canal bilateral red- q-tips/ fingernail    Nose: Nose normal.  Eyes:     Extraocular Movements: Extraocular movements intact.  Cardiovascular:     Rate and Rhythm: Normal rate and regular rhythm.  Pulmonary:     Effort: Pulmonary effort is normal.     Breath sounds:  Normal breath sounds.  Abdominal:     General: Bowel sounds are normal. There is distension.     Palpations: Abdomen is soft.  Musculoskeletal:     Cervical back: Normal range of motion and neck supple.     Comments: Right knee crepitus/popping  Skin:    General: Skin is warm and dry.  Neurological:     Mental Status: She is alert and oriented to person, place, and time.  Psychiatric:        Mood and Affect: Mood normal.        Behavior: Behavior normal.     Assessment & Plan  Shealee was seen today for knee pain.  Diagnoses and all orders for this visit:  Chronic pain of right knee See PE -     AMB referral to orthopedics  Patient have been counseled extensively about nutrition and exercise. Other issues discussed during this visit include:  low cholesterol diet, weight control and daily exercise, foot care, annual eye examinations at Ophthalmology, importance of adherence with medications and regular follow-up. We also discussed long term complications of uncontrolled diabetes and hypertension.   Return for PCP.  The patient was given clear instructions to go to ER or return to medical center if symptoms don't improve, worsen or new problems develop. The patient verbalized understanding. The patient was told to call to get lab results if they haven't heard anything in the next week.   This note has been created with Education officer, environmental. Any transcriptional errors are unintentional.   Marius Siemens, NP 11/21/2023, 3:10 PM

## 2023-12-05 NOTE — Telephone Encounter (Signed)
 Copied from CRM [id number]. Topic: Referral - Status >> Dec 05, 2023  8:34 AM Jorie Newness J wrote: Reason for CRM: The pts after visit summary on 04/28 suggests the pt needs an AMB referral to orthopedics. The pt called in this morning to f/u on that referral, I contacted [facility] and was advised to contact their sister clinic, [facility] so that Moira Andrews can sign off on the referral order. I was unable to get an answer from [facility] but the pt has been in pain since 04/21. Please advise, pt callback # [phone] >> Dec 05, 2023 10:03 AM Nurse Peggi Bowels B wrote: Provider and patient not at this office

## 2023-12-07 NOTE — Telephone Encounter (Signed)
 Not sure why this message was forwarded to me. You submitted the referral to ortho. Please have one of your staff give pt the info of where the referral was submitted and/or have Nicholes Barks f/u on this.

## 2024-01-09 ENCOUNTER — Encounter: Payer: Self-pay | Admitting: Internal Medicine

## 2024-01-09 ENCOUNTER — Other Ambulatory Visit: Payer: Self-pay

## 2024-01-09 ENCOUNTER — Ambulatory Visit: Payer: No Typology Code available for payment source | Attending: Internal Medicine | Admitting: Internal Medicine

## 2024-01-09 VITALS — BP 148/86 | HR 76 | Temp 98.2°F | Ht 66.0 in | Wt 263.0 lb

## 2024-01-09 DIAGNOSIS — G8929 Other chronic pain: Secondary | ICD-10-CM | POA: Diagnosis not present

## 2024-01-09 DIAGNOSIS — I10 Essential (primary) hypertension: Secondary | ICD-10-CM

## 2024-01-09 DIAGNOSIS — M25562 Pain in left knee: Secondary | ICD-10-CM | POA: Diagnosis not present

## 2024-01-09 DIAGNOSIS — N938 Other specified abnormal uterine and vaginal bleeding: Secondary | ICD-10-CM

## 2024-01-09 DIAGNOSIS — D500 Iron deficiency anemia secondary to blood loss (chronic): Secondary | ICD-10-CM

## 2024-01-09 MED ORDER — HYDROCHLOROTHIAZIDE 25 MG PO TABS
25.0000 mg | ORAL_TABLET | Freq: Every day | ORAL | 1 refills | Status: AC
  Filled 2024-04-30: qty 90, 90d supply, fill #0
  Filled 2024-07-12 – 2024-07-24 (×3): qty 90, 90d supply, fill #1

## 2024-01-09 MED ORDER — HYDRALAZINE HCL 10 MG PO TABS
10.0000 mg | ORAL_TABLET | Freq: Two times a day (BID) | ORAL | 1 refills | Status: AC
  Filled 2024-01-09: qty 180, 90d supply, fill #0
  Filled 2024-04-30: qty 180, 90d supply, fill #1

## 2024-01-09 NOTE — Patient Instructions (Signed)
 VISIT SUMMARY:  Today, we discussed your blood pressure management, chronic left knee pain, abnormal vaginal bleeding, and anemia. We made some adjustments to your medications and planned further evaluations to better understand and manage your conditions.  YOUR PLAN:  -HYPERTENSION: Hypertension means high blood pressure. Your blood pressure is still higher than our goal, so we are adding a new medication called hydralazine 10 mg to be taken twice daily. Continue taking hydrochlorothiazide  25 mg daily as well. Your prescription has been sent to the pharmacy.  -CHRONIC LEFT KNEE PAIN: Chronic left knee pain can be due to various reasons, including iliotibial band syndrome or other knee issues. We will get an x-ray of your left knee at [facility] and refer you to an orthopedic specialist for further evaluation. Continue taking ibuprofen  800 mg and Tylenol  500 mg as needed for pain.  -ABNORMAL VAGINAL BLEEDING: Abnormal vaginal bleeding involves heavy and prolonged bleeding. Since you missed your last gynecology appointment, we will resubmit the referral to gynecology for further evaluation.  -ANEMIA: Anemia means you have a lower than normal number of red blood cells. We will recheck your anemia status to see how well it is being managed with your current iron supplementation.  INSTRUCTIONS:  Please follow up with the orthopedic specialist after your x-ray. Make sure to attend your rescheduled gynecology appointment. Continue taking your medications as prescribed and monitor your blood pressure regularly. We will recheck your anemia status at your next visit.

## 2024-01-09 NOTE — Progress Notes (Signed)
 Patient ID: Kathryn Barrett, female    DOB: 1971/04/25  MRN: [id number]  CC: Hypertension (HTN f/u. Med refill. /L leg pain X5 mo - requesting ortho referral in [place]/No shingles vax. Waiting to resolve leg before pap.)   Subjective: Kathryn Barrett is a 53 y.o. female who presents for chronic ds management. Her concerns today include:  Pt with hx of HTN, obesity and anemia due to menorrhagia, PreDM.     Discussed the use of AI scribe software for clinical note transcription with the patient, who gave verbal consent to proceed.  History of Present Illness Kathryn Barrett is a 53 year old female with hypertension and anemia who presents for follow-up on blood pressure management and leg pain.  She manages hypertension with hydrochlorothiazide  25 mg daily, taken in the evenings. Blood pressure readings are approximately 136/60 and 132/80 at home. She limits salt intake.  C/o LT knee pain for several mths for which she had seen NP 11/21/23 and was referred to ortho at [facility].  She has not been called and would prefer referral to orthopedics here in [place].  Chronic knee pain is primarily on the left side, radiating from the side of the knee to the hip and sometimes to the back. The pain is associated with numbness at times along the lateral thigh and difficulty bending the knee, leading her to keep it straight. Endorses some swelling in the knee.  Pain occurs when lifting the leg and laying down and sometimes when standing or walking. There is no know injury/initiating factors. She takes ibuprofen  800 mg and Tylenol  500 mg once a day for pain. Last took 2 days ago.  Iron deficiency anemia.  Hemoglobin on last visit had decreased from 12.3-10.9.  She tells me that she continues to take iron daily.  Referred to GYN on last visit for dysfunctional uterine bleeding.  Appointment was sent to her via MyChart but she missed the appointment and did not reschedule.  Reports that her menstrual cycles the  past 2 months have been spotting lasting only 2 days. She had decline pelvic US . Wants pap but not today    Patient Active Problem List   Diagnosis Date Noted   Numbness of right foot 06/22/2021   Numbness and tingling of left hand 06/22/2021   Stress 06/22/2021   Hot flushes, perimenopausal 06/22/2021   Class 3 severe obesity due to excess calories with serious comorbidity and body mass index (BMI) of 40.0 to 44.9 in adult 07/07/2017   Iron deficiency anemia due to chronic blood loss 11/08/2016   Prediabetes 01/01/2014   Essential hypertension 01/01/2014   Menorrhagia 01/01/2014   Breast lump on left side at 6 o'clock position 07/31/2013     Current Outpatient Medications on File Prior to Visit  Medication Sig Dispense Refill   ferrous sulfate  (FEROSUL) 325 (65 FE) MG tablet Take 1 tablet (325 mg total) by mouth 2 (two) times daily with a meal. 120 tablet 2   gabapentin  (NEURONTIN ) 100 MG capsule Take 1 capsule (100 mg total) by mouth at bedtime. 30 capsule 1   polyethylene glycol (MIRALAX ) 17 g packet Take 17 g by mouth daily as needed. 30 each 3   Vitamin D , Ergocalciferol , (DRISDOL ) 1.25 MG (50000 UNIT) CAPS capsule Take 1 capsule (50,000 Units total) by mouth every 7 (seven) days. 4 capsule 2   No current facility-administered medications on file prior to visit.    Allergies  Allergen Reactions   Lisinopril  Swelling  Edema in hands and feet   Norvasc  [Amlodipine ] Swelling    Social History   Socioeconomic History   Marital status: Single    Spouse name: Not on file   Number of children: Not on file   Years of education: Not on file   Highest education level: Not on file  Occupational History   Not on file  Tobacco Use   Smoking status: Never   Smokeless tobacco: Never  Vaping Use   Vaping status: Never Used  Substance and Sexual Activity   Alcohol use: No    Alcohol/week: 0.0 standard drinks of alcohol   Drug use: No   Sexual activity: Yes    Partners:  Male    Birth control/protection: None  Other Topics Concern   Not on file  Social History Narrative   Are you right handed or left handed? right   Are you currently employed ? 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    What type of home do you live in: 1 story or 2 story? one    Caffeine none   Social Drivers of Corporate investment banker Strain: Low Risk  (09/09/2023)   Overall Financial Resource Strain (CARDIA)    Difficulty of Paying Living Expenses: Not hard at all  Food Insecurity: No Food Insecurity (09/09/2023)   Hunger Vital Sign    Worried About Running Out of Food in the Last Year: Never true    Ran Out of Food in the Last Year: Never true  Transportation Needs: No Transportation Needs (09/09/2023)   PRAPARE - Administrator, Civil Service (Medical): No    Lack of Transportation (Non-Medical): No  Physical Activity: Inactive (09/09/2023)   Exercise Vital Sign    Days of Exercise per Week: 0 days    Minutes of Exercise per Session: 0 min  Stress: No Stress Concern Present (09/09/2023)   [employer] of Occupational Health - Occupational Stress Questionnaire    Feeling of Stress : Not at all  Social Connections: Moderately Integrated (09/09/2023)   Social Connection and Isolation Panel    Frequency of Communication with Friends and Family: More than three times a week    Frequency of Social Gatherings with Friends and Family: More than three times a week    Attends Religious Services: More than 4 times per year    Active Member of [employer] or Organizations: Yes    Attends Banker Meetings: 1 to 4 times per year    Marital Status: Never married  Intimate Partner Violence: Not At Risk (09/09/2023)   Humiliation, Afraid, Rape, and Kick questionnaire    Fear of Current or Ex-Partner: No    Emotionally Abused: No    Physically Abused: No    Sexually Abused: No     Family History  Problem Relation Age of Onset   Breast cancer Mother        late 75's   Diabetes Maternal Grandmother    Diabetes Maternal Grandfather    Diabetes Paternal Grandmother    Diabetes Paternal Grandfather     History reviewed. No pertinent surgical history.  ROS: Review of Systems Negative except as stated above  PHYSICAL EXAM: BP (!) 148/86   Pulse 76   Temp 98.2 F (36.8 C) (Oral)   Ht 5' 6 (1.676 m)   Wt 263 lb (119.3 kg)  SpO2 100%   BMI 42.45 kg/m   Physical Exam  General appearance - alert, well appearing, and in no distress Mental status - normal mood, behavior, speech, dress, motor activity, and thought processes Chest - clear to auscultation, no wheezes, rales or rhonchi, symmetric air entry Heart - normal rate, regular rhythm, normal S1, S2, no murmurs, rubs, clicks or gallops Musculoskeletal -left knee: Patient has a large body habitus so difficult to confirm swelling.  Mild tenderness above the lateral joint line.  Significant discomfort with attempted passive range of motion.  Patient resists bending the knee.      Latest Ref Rng & Units 03/07/2023    3:36 PM 12/25/2021    3:33 PM 02/05/2021   12:13 PM  CMP  Glucose 70 - 99 mg/dL 87  90  92   BUN 6 - 24 mg/dL 11  10  11    Creatinine 0.57 - 1.00 mg/dL 1.61  0.96  0.45   Sodium 134 - 144 mmol/L 140  140  141   Potassium 3.5 - 5.2 mmol/L 3.6  3.6  4.6   Chloride 96 - 106 mmol/L 101  100  102   CO2 20 - 29 mmol/L 26  26  23    Calcium 8.7 - 10.2 mg/dL 9.9  40.9  9.2   Total Protein 6.0 - 8.5 g/dL 7.4   7.4   Total Bilirubin 0.0 - 1.2 mg/dL 0.3   0.3   Alkaline Phos 44 - 121 IU/L 134   102   AST 0 - 40 IU/L 13   10   ALT 0 - 32 IU/L 12   11    Lipid Panel     Component Value Date/Time   CHOL 125 02/05/2021 1213   TRIG 47 02/05/2021 1213   HDL 40 02/05/2021 1213   CHOLHDL 3.1 02/05/2021 1213   CHOLHDL 3.1 11/02/2013 1026   VLDL 10 11/02/2013 1026   LDLCALC 74 02/05/2021 1213     CBC    Component Value Date/Time   WBC 7.3 09/09/2023 0929   WBC 9.9 11/08/2016 0441   RBC 4.50 09/09/2023 0929   RBC 4.97 11/08/2016 0441   HGB 10.9 (L) 09/09/2023 0929   HCT 35.5 09/09/2023 0929   PLT 362 09/09/2023 0929   MCV 79 09/09/2023 0929   MCH 24.2 (L) 09/09/2023 0929   MCH 18.3 (L) 11/08/2016 0441   MCHC 30.7 (L) 09/09/2023 0929   MCHC 30.7 11/08/2016 0441   RDW 14.5 09/09/2023 0929   LYMPHSABS 2.1 03/07/2023 1536   MONOABS 0.7 01/31/2015 1215   EOSABS 0.3 03/07/2023 1536   BASOSABS 0.1 03/07/2023 1536    ASSESSMENT AND PLAN:  Assessment and Plan Assessment & Plan Hypertension Blood pressure remains above goal with current hydrochlorothiazide  25 mg daily. Amlodipine  not used due to previous intolerance. Hydralazine chosen as alternative. - Add hydralazine 10 mg twice daily. - Continue hydrochlorothiazide  25 mg daily. - Send prescription to pharmacy.  Chronic Left Knee Pain Chronic pain with swelling and difficulty bending, possibly due to iliotibial band syndrome or knee pathology. Managed with ibuprofen  and Tylenol . X-ray and orthopedic evaluation planned. - Order x-ray of the left knee at [facility]. - Refer to orthopedics for further evaluation. - Continue ibuprofen  800 mg and Tylenol  500 mg as needed for pain.  Abnormal Vaginal Bleeding Heavy and prolonged bleeding with recent lighter episodes. Missed gynecology appointment. - Resubmit referral to gynecology.  Anemia Anemia under management with iron supplementation. Re-evaluation needed. - Recheck  anemia status.   Patient was given the opportunity to ask questions.  Patient verbalized understanding of the plan and was able to repeat key elements of the plan.   This documentation was completed using Paediatric nurse.  Any transcriptional errors are unintentional.  Orders Placed This Encounter  Procedures   DG Knee Complete 4 Views Left   CBC   AMB referral to  orthopedics   Ambulatory referral to Gynecology     Requested Prescriptions   Signed Prescriptions Disp Refills   hydrochlorothiazide  (HYDRODIURIL ) 25 MG tablet 90 tablet 1    Sig: Take 1 tablet (25 mg total) by mouth daily.   hydrALAZINE (APRESOLINE) 10 MG tablet 180 tablet 1    Sig: Take 1 tablet (10 mg total) by mouth in the morning and at bedtime.    No follow-ups on file.  Concetta Dee, MD, FACP

## 2024-01-10 ENCOUNTER — Ambulatory Visit
Admission: RE | Admit: 2024-01-10 | Discharge: 2024-01-10 | Disposition: A | Discharge status: Home / Self Care | Source: Ambulatory Visit | Attending: Internal Medicine | Admitting: Internal Medicine

## 2024-01-10 ENCOUNTER — Ambulatory Visit: Payer: Self-pay | Admitting: Internal Medicine

## 2024-01-10 DIAGNOSIS — G8929 Other chronic pain: Secondary | ICD-10-CM

## 2024-01-10 LAB — CBC
Hematocrit: 40.6 % (ref 34.0–46.6)
Hemoglobin: 12.5 g/dL (ref 11.1–15.9)
MCH: 24.1 pg — ABNORMAL LOW (ref 26.6–33.0)
MCHC: 30.8 g/dL — ABNORMAL LOW (ref 31.5–35.7)
MCV: 78 fL — ABNORMAL LOW (ref 79–97)
Platelets: 292 10*3/uL (ref 150–450)
RBC: 5.19 x10E6/uL (ref 3.77–5.28)
RDW: 16.6 % — ABNORMAL HIGH (ref 11.7–15.4)
WBC: 6.2 10*3/uL (ref 3.4–10.8)

## 2024-01-11 ENCOUNTER — Other Ambulatory Visit: Payer: Self-pay

## 2024-01-20 ENCOUNTER — Other Ambulatory Visit (INDEPENDENT_AMBULATORY_CARE_PROVIDER_SITE_OTHER): Payer: Self-pay

## 2024-01-20 ENCOUNTER — Other Ambulatory Visit: Payer: Self-pay

## 2024-01-20 ENCOUNTER — Ambulatory Visit: Admitting: Orthopaedic Surgery

## 2024-01-20 VITALS — Ht 66.0 in | Wt 263.0 lb

## 2024-01-20 DIAGNOSIS — M79605 Pain in left leg: Secondary | ICD-10-CM | POA: Diagnosis not present

## 2024-01-20 MED ORDER — PREDNISONE 10 MG (21) PO TBPK
ORAL_TABLET | ORAL | 3 refills | Status: AC
  Filled 2024-01-20: qty 21, 6d supply, fill #0

## 2024-01-20 NOTE — Progress Notes (Signed)
 Office Visit Note   Patient: Kathryn Barrett           Date of Birth: July 19, 1971           MRN: [id number] Visit Date: 01/20/2024              Requested by: Vicci Barnie NOVAK, MD [address] [place],  [place] [address] PCP: Vicci Barnie NOVAK, MD   Assessment & Plan: Visit Diagnoses:  1. Pain in left leg     Plan: History of Present Illness Kathryn Barrett is a 53 year old female who presents with left knee pain.  She experiences severe left knee pain primarily on the lateral side, extending up the thigh, which impairs her ability to raise her leg. Swelling is often present. There is no groin pain or pain radiating past the knee. She has no history of injuries or surgeries on the left knee.  Physical Exam MUSCULOSKELETAL: No effusion in left knee, good flexion. Tenderness and pain on lateral side of left hip with flexion.  Assessment and Plan Bursitis of hip Chronic lateral hip pain likely due to bursitis. Pain localized to lateral hip, not extending past knee. X-rays show arthritis, but pain primarily from bursa inflammation. - Prescribe prednisone Dosepak - Provide exercises for bursitis management. - Refer to Dr. Burnetta for cortisone injection if no improvement.  Osteoarthritis X-rays show degenerative arthritis. Not primary pain cause but may affect gait. Focus on symptom control and joint function maintenance. - Discuss lifestyle modifications: weight management, physical conditioning. - Consider medications and supplements for symptom management.  Follow-Up Instructions: No follow-ups on file.   Orders:  Orders Placed This Encounter  Procedures   XR HIP UNILAT W OR W/O PELVIS 1V LEFT   Meds ordered this encounter  Medications   predniSONE (STERAPRED UNI-PAK 21 TAB) 10 MG (21) TBPK tablet    Sig: Take as directed    Dispense:  21 tablet    Refill:  3   Subjective: Chief Complaint  Patient presents with   Left Knee - Pain    HPI  Review of  Systems  Constitutional: Negative.   HENT: Negative.    Eyes: Negative.   Respiratory: Negative.    Cardiovascular: Negative.   Endocrine: Negative.   Musculoskeletal: Negative.   Neurological: Negative.   Hematological: Negative.   Psychiatric/Behavioral: Negative.    All other systems reviewed and are negative.    Objective: Vital Signs: Ht 5' 6 (1.676 m)   Wt 263 lb (119.3 kg)   BMI 42.45 kg/m   Physical Exam Vitals and nursing note reviewed.  Constitutional:      Appearance: She is well-developed.  HENT:     Head: Atraumatic.     Nose: Nose normal.   Eyes:     Extraocular Movements: Extraocular movements intact.    Cardiovascular:     Pulses: Normal pulses.  Pulmonary:     Effort: Pulmonary effort is normal.  Abdominal:     Palpations: Abdomen is soft.   Musculoskeletal:     Cervical back: Neck supple.   Skin:    General: Skin is warm.     Capillary Refill: Capillary refill takes less than 2 seconds.   Neurological:     Mental Status: She is alert. Mental status is at baseline.   Psychiatric:        Behavior: Behavior normal.        Thought Content: Thought content normal.  Judgment: Judgment normal.      Imaging: XR HIP UNILAT W OR W/O PELVIS 1V LEFT Result Date: 01/20/2024 X-rays of the pelvis show arthritic changes without acute abnormalities.    PMFS History: Patient Active Problem List   Diagnosis Date Noted   Numbness of right foot 06/22/2021   Numbness and tingling of left hand 06/22/2021   Stress 06/22/2021   Hot flushes, perimenopausal 06/22/2021   Class 3 severe obesity due to excess calories with serious comorbidity and body mass index (BMI) of 40.0 to 44.9 in adult 07/07/2017   Iron deficiency anemia due to chronic blood loss 11/08/2016   Prediabetes 01/01/2014   Essential hypertension 01/01/2014   Menorrhagia 01/01/2014   Breast lump on left side at 6 o'clock position 07/31/2013   Past Medical History:  Diagnosis  Date   Hypertension     Family History  Problem Relation Age of Onset   Breast cancer Mother        late 69's   Diabetes Maternal Grandmother    Diabetes Maternal Grandfather    Diabetes Paternal Grandmother    Diabetes Paternal Grandfather     No past surgical history on file. Social History   Occupational History   Not on file  Tobacco Use   Smoking status: Never   Smokeless tobacco: Never  Vaping Use   Vaping status: Never Used  Substance and Sexual Activity   Alcohol use: No    Alcohol/week: 0.0 standard drinks of alcohol   Drug use: No   Sexual activity: Yes    Partners: Male    Birth control/protection: None

## 2024-02-01 ENCOUNTER — Other Ambulatory Visit: Payer: Self-pay

## 2024-03-22 LAB — HM MAMMOGRAPHY

## 2024-03-27 ENCOUNTER — Encounter: Payer: Self-pay | Admitting: Internal Medicine

## 2024-03-28 ENCOUNTER — Ambulatory Visit: Payer: Self-pay | Admitting: Internal Medicine

## 2024-05-01 ENCOUNTER — Other Ambulatory Visit: Payer: Self-pay

## 2024-05-03 ENCOUNTER — Other Ambulatory Visit: Payer: Self-pay

## 2024-06-13 ENCOUNTER — Encounter: Admitting: Obstetrics and Gynecology

## 2024-07-12 ENCOUNTER — Other Ambulatory Visit: Payer: Self-pay | Admitting: Internal Medicine

## 2024-07-12 DIAGNOSIS — I10 Essential (primary) hypertension: Secondary | ICD-10-CM

## 2024-07-13 ENCOUNTER — Other Ambulatory Visit: Payer: Self-pay

## 2024-07-13 MED ORDER — HYDRALAZINE HCL 10 MG PO TABS
10.0000 mg | ORAL_TABLET | Freq: Two times a day (BID) | ORAL | 1 refills | Status: AC
  Filled 2024-07-13 – 2024-07-24 (×2): qty 180, 90d supply, fill #0

## 2024-07-24 ENCOUNTER — Other Ambulatory Visit: Payer: Self-pay
